# Patient Record
Sex: Female | Born: 1950 | Race: White | Hispanic: No | Marital: Single | Smoking: Never smoker
Health system: Southern US, Community
[De-identification: ages and names within clinical notes are randomized; demographics above are authoritative.]

## PROBLEM LIST (undated history)

## (undated) DIAGNOSIS — I509 Heart failure, unspecified: Secondary | ICD-10-CM

## (undated) DIAGNOSIS — G839 Paralytic syndrome, unspecified: Secondary | ICD-10-CM

## (undated) DIAGNOSIS — K219 Gastro-esophageal reflux disease without esophagitis: Secondary | ICD-10-CM

## (undated) DIAGNOSIS — G709 Myoneural disorder, unspecified: Secondary | ICD-10-CM

## (undated) DIAGNOSIS — G8929 Other chronic pain: Secondary | ICD-10-CM

## (undated) DIAGNOSIS — F419 Anxiety disorder, unspecified: Secondary | ICD-10-CM

## (undated) DIAGNOSIS — E785 Hyperlipidemia, unspecified: Secondary | ICD-10-CM

## (undated) DIAGNOSIS — IMO0001 Reserved for inherently not codable concepts without codable children: Secondary | ICD-10-CM

## (undated) DIAGNOSIS — N390 Urinary tract infection, site not specified: Secondary | ICD-10-CM

## (undated) DIAGNOSIS — G950 Syringomyelia and syringobulbia: Secondary | ICD-10-CM

## (undated) DIAGNOSIS — I10 Essential (primary) hypertension: Secondary | ICD-10-CM

## (undated) DIAGNOSIS — K5909 Other constipation: Secondary | ICD-10-CM

## (undated) DIAGNOSIS — I5181 Takotsubo syndrome: Secondary | ICD-10-CM

## (undated) DIAGNOSIS — I739 Peripheral vascular disease, unspecified: Secondary | ICD-10-CM

## (undated) HISTORY — PX: NECK SURGERY: SHX720

---

## 2014-12-29 ENCOUNTER — Emergency Department (HOSPITAL_COMMUNITY): Payer: Medicare Other

## 2014-12-29 ENCOUNTER — Encounter (HOSPITAL_COMMUNITY): Payer: Self-pay | Admitting: *Deleted

## 2014-12-29 ENCOUNTER — Emergency Department (HOSPITAL_COMMUNITY)
Admission: EM | Admit: 2014-12-29 | Discharge: 2014-12-29 | Disposition: A | Discharge status: Home / Self Care | Payer: Medicare Other | Attending: Emergency Medicine | Admitting: Emergency Medicine

## 2014-12-29 DIAGNOSIS — T80212A Local infection due to central venous catheter, initial encounter: Secondary | ICD-10-CM | POA: Insufficient documentation

## 2014-12-29 DIAGNOSIS — Z792 Long term (current) use of antibiotics: Secondary | ICD-10-CM | POA: Diagnosis not present

## 2014-12-29 DIAGNOSIS — G8929 Other chronic pain: Secondary | ICD-10-CM | POA: Diagnosis not present

## 2014-12-29 DIAGNOSIS — Z79899 Other long term (current) drug therapy: Secondary | ICD-10-CM | POA: Insufficient documentation

## 2014-12-29 DIAGNOSIS — T80219A Unspecified infection due to central venous catheter, initial encounter: Secondary | ICD-10-CM

## 2014-12-29 DIAGNOSIS — K5900 Constipation, unspecified: Secondary | ICD-10-CM | POA: Insufficient documentation

## 2014-12-29 DIAGNOSIS — E785 Hyperlipidemia, unspecified: Secondary | ICD-10-CM | POA: Insufficient documentation

## 2014-12-29 DIAGNOSIS — Z8669 Personal history of other diseases of the nervous system and sense organs: Secondary | ICD-10-CM | POA: Diagnosis not present

## 2014-12-29 DIAGNOSIS — Z7982 Long term (current) use of aspirin: Secondary | ICD-10-CM | POA: Diagnosis not present

## 2014-12-29 DIAGNOSIS — B999 Unspecified infectious disease: Secondary | ICD-10-CM

## 2014-12-29 DIAGNOSIS — Y832 Surgical operation with anastomosis, bypass or graft as the cause of abnormal reaction of the patient, or of later complication, without mention of misadventure at the time of the procedure: Secondary | ICD-10-CM | POA: Diagnosis not present

## 2014-12-29 DIAGNOSIS — Z87440 Personal history of urinary (tract) infections: Secondary | ICD-10-CM | POA: Diagnosis not present

## 2014-12-29 HISTORY — DX: Urinary tract infection, site not specified: N39.0

## 2014-12-29 HISTORY — DX: Other chronic pain: G89.29

## 2014-12-29 HISTORY — DX: Other constipation: K59.09

## 2014-12-29 HISTORY — DX: Syringomyelia and syringobulbia: G95.0

## 2014-12-29 HISTORY — DX: Hyperlipidemia, unspecified: E78.5

## 2014-12-29 HISTORY — DX: Paralytic syndrome, unspecified: G83.9

## 2014-12-29 LAB — CBC WITH DIFFERENTIAL/PLATELET
Basophils Absolute: 0 10*3/uL (ref 0.0–0.1)
Basophils Relative: 0 % (ref 0–1)
EOS ABS: 0.1 10*3/uL (ref 0.0–0.7)
EOS PCT: 2 % (ref 0–5)
HEMATOCRIT: 35.9 % — AB (ref 36.0–46.0)
Hemoglobin: 11.4 g/dL — ABNORMAL LOW (ref 12.0–15.0)
LYMPHS ABS: 0.9 10*3/uL (ref 0.7–4.0)
LYMPHS PCT: 15 % (ref 12–46)
MCH: 27.7 pg (ref 26.0–34.0)
MCHC: 31.8 g/dL (ref 30.0–36.0)
MCV: 87.1 fL (ref 78.0–100.0)
MONOS PCT: 8 % (ref 3–12)
Monocytes Absolute: 0.5 10*3/uL (ref 0.1–1.0)
Neutro Abs: 4.7 10*3/uL (ref 1.7–7.7)
Neutrophils Relative %: 75 % (ref 43–77)
Platelets: 216 10*3/uL (ref 150–400)
RBC: 4.12 MIL/uL (ref 3.87–5.11)
RDW: 13.6 % (ref 11.5–15.5)
WBC: 6.2 10*3/uL (ref 4.0–10.5)

## 2014-12-29 LAB — COMPREHENSIVE METABOLIC PANEL
ALBUMIN: 3.9 g/dL (ref 3.5–5.0)
ALK PHOS: 71 U/L (ref 38–126)
ALT: 13 U/L — ABNORMAL LOW (ref 14–54)
AST: 29 U/L (ref 15–41)
Anion gap: 9 (ref 5–15)
BILIRUBIN TOTAL: 1.1 mg/dL (ref 0.3–1.2)
BUN: 8 mg/dL (ref 6–20)
CHLORIDE: 102 mmol/L (ref 101–111)
CO2: 27 mmol/L (ref 22–32)
Calcium: 9 mg/dL (ref 8.9–10.3)
Creatinine, Ser: 0.59 mg/dL (ref 0.44–1.00)
GFR calc Af Amer: >60 mL/min (ref >60)
GFR calc non Af Amer: >60 mL/min (ref >60)
Glucose, Bld: 93 mg/dL (ref 65–99)
POTASSIUM: 4.9 mmol/L (ref 3.5–5.1)
Sodium: 138 mmol/L (ref 135–145)
Total Protein: 7.6 g/dL (ref 6.5–8.1)

## 2014-12-29 LAB — PROTIME-INR
INR: 1.02 (ref 0.00–1.49)
Prothrombin Time: 13.6 seconds (ref 11.6–15.2)

## 2014-12-29 LAB — APTT: aPTT: 30 seconds (ref 24–37)

## 2014-12-29 MED ORDER — CEFAZOLIN SODIUM-DEXTROSE 2-3 GM-% IV SOLR
INTRAVENOUS | Status: AC
  Filled 2014-12-29: qty 50

## 2014-12-29 MED ORDER — LIDOCAINE-EPINEPHRINE 2 %-1:100000 IJ SOLN
INTRAMUSCULAR | Status: AC
  Filled 2014-12-29: qty 1

## 2014-12-29 MED ORDER — MIDAZOLAM HCL 2 MG/2ML IJ SOLN
INTRAMUSCULAR | Status: AC
  Filled 2014-12-29: qty 4

## 2014-12-29 MED ORDER — CEFAZOLIN SODIUM-DEXTROSE 2-3 GM-% IV SOLR
2.0000 g | INTRAVENOUS | Status: AC
  Administered 2014-12-29: 2 g via INTRAVENOUS

## 2014-12-29 MED ORDER — FENTANYL CITRATE (PF) 100 MCG/2ML IJ SOLN
INTRAMUSCULAR | Status: AC
  Filled 2014-12-29: qty 2

## 2014-12-29 NOTE — ED Notes (Signed)
[facility] called for transport.  
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
## 2014-12-29 NOTE — Procedures (Signed)
Successful removal of right anterior chest wall port-a-cath. No immediate post procedural complications.  

## 2014-12-29 NOTE — ED Notes (Signed)
Patient transported to X-ray 

## 2014-12-29 NOTE — Progress Notes (Signed)
pcp is [facility] on site md

## 2014-12-29 NOTE — ED Notes (Signed)
Patient transported to MRI 

## 2014-12-29 NOTE — ED Notes (Signed)
Reviewed discharge information with pt, explaining signs and symptoms of infection.  She verbalized understanding and knows that is s/s return to request that her PCP evaluate the former port site.

## 2014-12-29 NOTE — ED Notes (Addendum)
Per ems pt from home, recently moved from [place], pt is at [facility] [place]. Home health came to check on pt this morning and saw port in right chest was oozing. Pt reports port has been progressively getting redder x2 weeks. Last time port was accessed was 5 weeks ago.   Pt reports she has port due to bad IV access.

## 2014-12-29 NOTE — ED Notes (Signed)
Patient transported to IR 

## 2014-12-29 NOTE — ED Notes (Signed)
In interventional radiology

## 2014-12-29 NOTE — ED Notes (Signed)
Provided pt with drink and snack per her request; emptied catheter bag. No acute distress noted.

## 2014-12-29 NOTE — ED Notes (Signed)
Bed: IN86 Expected date:  Expected time:  Means of arrival:  Comments: Ems- wheelchair bound, port issue

## 2014-12-29 NOTE — H&P (Signed)
Chief Complaint: Chief Complaint  Patient presents with  . infected port     Referring Physician(s): Pickering,N  History of Present Illness: Deanna Morgan is a 64 y.o. female who presents today from the emergency room secondary to tenderness and erythema at right upper chest Port-A-Cath site. Patient had right Port-A-Cath placed in [place] for poor venous access. Port was last accessed approximately 5 weeks ago. Additional past medical history as listed below. Today she also noted small amount of bleeding around the port hub. She presents today for Port-A-Cath removal.  Past Medical History  Diagnosis Date  . Chronic pain   . Chronic constipation   . Chronic UTI   . Syringomyelia   . Paralysis   . Hyperlipidemia     No past surgical history on file.  Allergies: Review of patient's allergies indicates no known allergies.  Medications: Prior to Admission medications   Medication Sig Start Date End Date Taking? Authorizing Provider  acidophilus (RISAQUAD) CAPS capsule Take 1 capsule by mouth 2 (two) times daily.   Yes Historical Provider, MD  ALPRAZolam Prudy Feeler) 0.5 MG tablet Take 0.5 mg by mouth every 8 (eight) hours as needed for anxiety.   Yes Historical Provider, MD  aspirin 81 MG tablet Take 81 mg by mouth daily.   Yes Historical Provider, MD  baclofen (LIORESAL) 20 MG tablet Take 20 mg by mouth 2 (two) times daily.   Yes Historical Provider, MD  bisacodyl (LAXATIVE) 10 MG suppository Place 10 mg rectally as needed for mild constipation or moderate constipation.   Yes Historical Provider, MD  Chloroxylenol-Zinc Oxide (BAZA EX) Apply 1 application topically 2 (two) times daily. Apply to buttocks until healed   Yes Historical Provider, MD  clindamycin (CLEOCIN) 300 MG capsule Take 300 mg by mouth 3 (three) times daily.   Yes Historical Provider, MD  Cranberry 425 MG CAPS Take 425 mg by mouth 2 (two) times daily.   Yes Historical Provider, MD  docusate sodium  (COLACE) 100 MG capsule Take 100 mg by mouth 2 (two) times daily.   Yes Historical Provider, MD  fluocinonide cream (LIDEX) 0.05 % Apply 1 application topically daily. Apply to affected areas on scalp   Yes Historical Provider, MD  ibuprofen (ADVIL,MOTRIN) 200 MG tablet Take 400 mg by mouth every 8 (eight) hours as needed for mild pain or moderate pain.   Yes Historical Provider, MD  ketoconazole (NIZORAL) 2 % shampoo Apply 1 application topically 2 (two) times a week. Provide and apply on skin as directed. Lather and let sit for a few minutes prior to rinsing when showering.   Yes Historical Provider, MD  Mineral Oil Heavy OIL Place 2 drops into both ears once a week.   Yes Historical Provider, MD  morphine (MS CONTIN) 60 MG 12 hr tablet Take 60 mg by mouth 2 (two) times daily.   Yes Historical Provider, MD  Multiple Vitamin (DAILY VITE PO) Take 1 tablet by mouth daily.   Yes Historical Provider, MD  Omega 3 1000 MG CAPS Take 1,000 mg by mouth daily.   Yes Historical Provider, MD  oxybutynin (DITROPAN XL) 15 MG 24 hr tablet Take 15 mg by mouth daily.   Yes Historical Provider, MD  polyethylene glycol (MIRALAX / GLYCOLAX) packet Take 17 g by mouth daily as needed for mild constipation or moderate constipation. Mix 1 capful (17g) with 8 oz of fluid   Yes Historical Provider, MD  saccharomyces boulardii (FLORASTOR) 250 MG capsule Take  250 mg by mouth 2 (two) times daily.   Yes Historical Provider, MD  sertraline (ZOLOFT) 50 MG tablet Take 50 mg by mouth daily.   Yes Historical Provider, MD  simvastatin (ZOCOR) 40 MG tablet Take 40 mg by mouth at bedtime.   Yes Historical Provider, MD  sodium phosphate (FLEET) enema Place 1 enema rectally daily as needed (constipation). follow package directions   Yes Historical Provider, MD  vitamin C (ASCORBIC ACID) 500 MG tablet Take 500 mg by mouth 2 (two) times daily.   Yes Historical Provider, MD     No family history on file.  History   Social History  .  Marital Status: Single    Spouse Name: N/A  . Number of Children: N/A  . Years of Education: N/A   Social History Main Topics  . Smoking status: Not on file  . Smokeless tobacco: Not on file  . Alcohol Use: Not on file  . Drug Use: Not on file  . Sexual Activity: Not on file   Other Topics Concern  . Not on file   Social History Narrative  . No narrative on file      Review of Systems  Constitutional: Negative for fever and chills.  Respiratory: Negative for cough and shortness of breath.   Cardiovascular:       Denies substernal chest pain. Mild pain noted at right chest wall port site  Gastrointestinal: Negative for nausea, vomiting and abdominal pain.  Genitourinary: Negative for dysuria and hematuria.  Musculoskeletal: Negative for back pain.  Neurological: Negative for headaches.    Vital Signs: BP 122/70 mmHg  Pulse 76  Temp(Src) 98.5 F (36.9 C) (Oral)  Resp 16  SpO2 96%  Physical Exam  Constitutional: She is oriented to person, place, and time.  Cardiovascular: Normal rate and regular rhythm.   Pulmonary/Chest: Effort normal.  Minutes breath sounds right base, left clear. Right upper chest wall Port-A-Cath site with erythema, small amount of skin breakdown over the port hub and mild to moderate tenderness to palpation  Abdominal: Soft. Bowel sounds are normal. There is no tenderness.  Mild distention  Musculoskeletal:  Patient is wheelchair bound; history of syringomyelia and paralysis; arthritic changes both hands  Neurological: She is alert and oriented to person, place, and time.    Mallampati Score:     Imaging: Dg Chest 2 View  12/29/2014   CLINICAL DATA:  Bruising from right chest port.  Weakness  EXAM: CHEST  2 VIEW  COMPARISON:  None.  FINDINGS: Ill-defined opacity is noted in the periphery of the right lower lobe. There is some ill-defined opacity in the right mid lung, inferior to the Port-A-Cath hub. Lungs elsewhere are clear. Heart size  and pulmonary vascularity are normal. No adenopathy. Port-A-Cath tip is in the superior vena cava. No pneumothorax. There is a total shoulder replacement on the right with dislocation of the humerus medial to the glenoid. There is dislocation of the left shoulder with the left humeral head just inferior to the coracoid process.  IMPRESSION: Infiltrate periphery of right lower lobe. Suspect pneumonia. Ill-defined opacity right mid lung could represent pneumonia but also could represent leakage from the hub of the Port-A-Cath. Lungs elsewhere clear. Bilateral shoulder dislocations. No total shoulder replacement on the right. Followup PA and lateral chest radiographs recommended in 3-4 weeks following trial of antibiotic therapy to ensure resolution and exclude underlying malignancy.   Electronically Signed   By: Bretta Bang III M.D.   On: 12/29/2014  13:29    Labs:  CBC:  Recent Labs  12/29/14 1210  WBC 6.2  HGB 11.4*  HCT 35.9*  PLT 216    COAGS:  Recent Labs  12/29/14 1210  INR 1.02  APTT 30    BMP:  Recent Labs  12/29/14 1210  NA 138  K 4.9  CL 102  CO2 27  GLUCOSE 93  BUN 8  CALCIUM 9.0  CREATININE 0.59  GFRNONAA >60  GFRAA >60    LIVER FUNCTION TESTS:  Recent Labs  12/29/14 1210  BILITOT 1.1  AST 29  ALT 13*  ALKPHOS 71  PROT 7.6  ALBUMIN 3.9    TUMOR MARKERS: No results for input(s): AFPTM, CEA, CA199, CHROMGRNA in the last 8760 hours.  Assessment and Plan: Patient with erythematous, tender right chest wall Port-A-Cath with associated superficial skin breakdown at port hub. Plan today is for Port-A-Cath removal. Details/risks of procedure, including but not limited to, bleeding, infection discussed with patient with her understanding and consent.     Signed: D. Jeananne Rama 12/29/2014, 2:29 PM   I spent a total of 20 minutes in face to face in clinical consultation, greater than 50% of which was counseling/coordinating care for Port-A-Cath  removal

## 2014-12-30 ENCOUNTER — Other Ambulatory Visit (HOSPITAL_COMMUNITY): Payer: Self-pay | Admitting: Interventional Radiology

## 2014-12-30 DIAGNOSIS — B999 Unspecified infectious disease: Secondary | ICD-10-CM

## 2014-12-31 ENCOUNTER — Ambulatory Visit (HOSPITAL_COMMUNITY)
Admission: RE | Admit: 2014-12-31 | Discharge: 2014-12-31 | Disposition: A | Discharge status: Home / Self Care | Payer: Medicare Other | Source: Ambulatory Visit | Attending: Interventional Radiology | Admitting: Interventional Radiology

## 2014-12-31 DIAGNOSIS — Z4800 Encounter for change or removal of nonsurgical wound dressing: Secondary | ICD-10-CM | POA: Insufficient documentation

## 2014-12-31 DIAGNOSIS — B999 Unspecified infectious disease: Secondary | ICD-10-CM

## 2014-12-31 NOTE — Progress Notes (Signed)
Patient ID: Deanna Morgan, female   DOB: [birth year], 64 y.o.   MRN: [id number]   Pt returns for outpt followup of the port site  Rt chest site iodoform gauze removed.  Port site with mild erythema, and induration.  Feels less painful.  No drainage  Wet to dry dressing performed by IR nursing staff.  Continue Wet to dry dressing changes BID for 7 days at the SNF.  Orders written for this to return with the patient.

## 2015-03-10 DIAGNOSIS — I5181 Takotsubo syndrome: Secondary | ICD-10-CM

## 2015-03-10 HISTORY — DX: Takotsubo syndrome: I51.81

## 2015-03-14 ENCOUNTER — Other Ambulatory Visit (HOSPITAL_COMMUNITY): Payer: Medicare Other

## 2015-03-14 ENCOUNTER — Emergency Department (HOSPITAL_COMMUNITY): Payer: Medicare Other

## 2015-03-14 ENCOUNTER — Inpatient Hospital Stay (HOSPITAL_COMMUNITY): Payer: Medicare Other

## 2015-03-14 ENCOUNTER — Encounter (HOSPITAL_COMMUNITY): Payer: Self-pay | Admitting: *Deleted

## 2015-03-14 ENCOUNTER — Inpatient Hospital Stay (HOSPITAL_COMMUNITY)
Admission: EM | Admit: 2015-03-14 | Discharge: 2015-03-25 | DRG: 871 | Disposition: A | Discharge status: Nursing Home (Includes ICF) | Payer: Medicare Other | Attending: Internal Medicine | Admitting: Internal Medicine

## 2015-03-14 DIAGNOSIS — N3289 Other specified disorders of bladder: Secondary | ICD-10-CM | POA: Diagnosis not present

## 2015-03-14 DIAGNOSIS — I2510 Atherosclerotic heart disease of native coronary artery without angina pectoris: Secondary | ICD-10-CM | POA: Diagnosis present

## 2015-03-14 DIAGNOSIS — G8929 Other chronic pain: Secondary | ICD-10-CM | POA: Diagnosis present

## 2015-03-14 DIAGNOSIS — I214 Non-ST elevation (NSTEMI) myocardial infarction: Secondary | ICD-10-CM | POA: Insufficient documentation

## 2015-03-14 DIAGNOSIS — I5181 Takotsubo syndrome: Secondary | ICD-10-CM | POA: Diagnosis present

## 2015-03-14 DIAGNOSIS — R34 Anuria and oliguria: Secondary | ICD-10-CM | POA: Diagnosis present

## 2015-03-14 DIAGNOSIS — E876 Hypokalemia: Secondary | ICD-10-CM | POA: Diagnosis present

## 2015-03-14 DIAGNOSIS — T8351XA Infection and inflammatory reaction due to indwelling urinary catheter, initial encounter: Secondary | ICD-10-CM | POA: Diagnosis present

## 2015-03-14 DIAGNOSIS — R7989 Other specified abnormal findings of blood chemistry: Secondary | ICD-10-CM

## 2015-03-14 DIAGNOSIS — K5909 Other constipation: Secondary | ICD-10-CM | POA: Diagnosis present

## 2015-03-14 DIAGNOSIS — G9340 Encephalopathy, unspecified: Secondary | ICD-10-CM | POA: Diagnosis present

## 2015-03-14 DIAGNOSIS — K5641 Fecal impaction: Secondary | ICD-10-CM

## 2015-03-14 DIAGNOSIS — R748 Abnormal levels of other serum enzymes: Secondary | ICD-10-CM | POA: Diagnosis not present

## 2015-03-14 DIAGNOSIS — G950 Syringomyelia and syringobulbia: Secondary | ICD-10-CM | POA: Diagnosis present

## 2015-03-14 DIAGNOSIS — J690 Pneumonitis due to inhalation of food and vomit: Secondary | ICD-10-CM | POA: Diagnosis present

## 2015-03-14 DIAGNOSIS — Q211 Atrial septal defect: Secondary | ICD-10-CM | POA: Diagnosis not present

## 2015-03-14 DIAGNOSIS — R0902 Hypoxemia: Secondary | ICD-10-CM | POA: Diagnosis not present

## 2015-03-14 DIAGNOSIS — R1310 Dysphagia, unspecified: Secondary | ICD-10-CM | POA: Diagnosis present

## 2015-03-14 DIAGNOSIS — E785 Hyperlipidemia, unspecified: Secondary | ICD-10-CM | POA: Diagnosis present

## 2015-03-14 DIAGNOSIS — Y846 Urinary catheterization as the cause of abnormal reaction of the patient, or of later complication, without mention of misadventure at the time of the procedure: Secondary | ICD-10-CM | POA: Diagnosis present

## 2015-03-14 DIAGNOSIS — K567 Ileus, unspecified: Secondary | ICD-10-CM | POA: Insufficient documentation

## 2015-03-14 DIAGNOSIS — J189 Pneumonia, unspecified organism: Secondary | ICD-10-CM | POA: Diagnosis not present

## 2015-03-14 DIAGNOSIS — K5901 Slow transit constipation: Secondary | ICD-10-CM | POA: Diagnosis not present

## 2015-03-14 DIAGNOSIS — E780 Pure hypercholesterolemia: Secondary | ICD-10-CM | POA: Diagnosis present

## 2015-03-14 DIAGNOSIS — K562 Volvulus: Secondary | ICD-10-CM | POA: Diagnosis present

## 2015-03-14 DIAGNOSIS — R109 Unspecified abdominal pain: Secondary | ICD-10-CM | POA: Insufficient documentation

## 2015-03-14 DIAGNOSIS — Y95 Nosocomial condition: Secondary | ICD-10-CM | POA: Diagnosis present

## 2015-03-14 DIAGNOSIS — E872 Acidosis: Secondary | ICD-10-CM | POA: Diagnosis present

## 2015-03-14 DIAGNOSIS — J9601 Acute respiratory failure with hypoxia: Secondary | ICD-10-CM | POA: Insufficient documentation

## 2015-03-14 DIAGNOSIS — I4510 Unspecified right bundle-branch block: Secondary | ICD-10-CM | POA: Diagnosis present

## 2015-03-14 DIAGNOSIS — I10 Essential (primary) hypertension: Secondary | ICD-10-CM | POA: Diagnosis present

## 2015-03-14 DIAGNOSIS — Z7982 Long term (current) use of aspirin: Secondary | ICD-10-CM

## 2015-03-14 DIAGNOSIS — I739 Peripheral vascular disease, unspecified: Secondary | ICD-10-CM | POA: Diagnosis present

## 2015-03-14 DIAGNOSIS — F419 Anxiety disorder, unspecified: Secondary | ICD-10-CM | POA: Diagnosis present

## 2015-03-14 DIAGNOSIS — T40605A Adverse effect of unspecified narcotics, initial encounter: Secondary | ICD-10-CM | POA: Diagnosis present

## 2015-03-14 DIAGNOSIS — N390 Urinary tract infection, site not specified: Secondary | ICD-10-CM | POA: Diagnosis present

## 2015-03-14 DIAGNOSIS — Z79899 Other long term (current) drug therapy: Secondary | ICD-10-CM

## 2015-03-14 DIAGNOSIS — G8250 Quadriplegia, unspecified: Secondary | ICD-10-CM | POA: Diagnosis present

## 2015-03-14 DIAGNOSIS — E871 Hypo-osmolality and hyponatremia: Secondary | ICD-10-CM | POA: Diagnosis present

## 2015-03-14 DIAGNOSIS — Z993 Dependence on wheelchair: Secondary | ICD-10-CM | POA: Diagnosis not present

## 2015-03-14 DIAGNOSIS — I959 Hypotension, unspecified: Secondary | ICD-10-CM

## 2015-03-14 DIAGNOSIS — I5032 Chronic diastolic (congestive) heart failure: Secondary | ICD-10-CM | POA: Insufficient documentation

## 2015-03-14 DIAGNOSIS — I5021 Acute systolic (congestive) heart failure: Secondary | ICD-10-CM | POA: Diagnosis not present

## 2015-03-14 DIAGNOSIS — K560 Paralytic ileus: Secondary | ICD-10-CM | POA: Insufficient documentation

## 2015-03-14 DIAGNOSIS — K219 Gastro-esophageal reflux disease without esophagitis: Secondary | ICD-10-CM | POA: Diagnosis present

## 2015-03-14 DIAGNOSIS — R4182 Altered mental status, unspecified: Secondary | ICD-10-CM | POA: Diagnosis not present

## 2015-03-14 DIAGNOSIS — K5900 Constipation, unspecified: Secondary | ICD-10-CM | POA: Insufficient documentation

## 2015-03-14 DIAGNOSIS — A419 Sepsis, unspecified organism: Secondary | ICD-10-CM | POA: Diagnosis present

## 2015-03-14 DIAGNOSIS — I5041 Acute combined systolic (congestive) and diastolic (congestive) heart failure: Secondary | ICD-10-CM | POA: Diagnosis present

## 2015-03-14 DIAGNOSIS — L8990 Pressure ulcer of unspecified site, unspecified stage: Secondary | ICD-10-CM | POA: Insufficient documentation

## 2015-03-14 DIAGNOSIS — R0600 Dyspnea, unspecified: Secondary | ICD-10-CM | POA: Diagnosis not present

## 2015-03-14 DIAGNOSIS — I428 Other cardiomyopathies: Secondary | ICD-10-CM | POA: Diagnosis not present

## 2015-03-14 DIAGNOSIS — R100 Acute abdomen: Secondary | ICD-10-CM | POA: Diagnosis not present

## 2015-03-14 HISTORY — DX: Gastro-esophageal reflux disease without esophagitis: K21.9

## 2015-03-14 HISTORY — DX: Essential (primary) hypertension: I10

## 2015-03-14 HISTORY — DX: Anxiety disorder, unspecified: F41.9

## 2015-03-14 HISTORY — DX: Peripheral vascular disease, unspecified: I73.9

## 2015-03-14 HISTORY — DX: Reserved for inherently not codable concepts without codable children: IMO0001

## 2015-03-14 HISTORY — DX: Myoneural disorder, unspecified: G70.9

## 2015-03-14 LAB — URINALYSIS, ROUTINE W REFLEX MICROSCOPIC
BILIRUBIN URINE: NEGATIVE
Glucose, UA: NEGATIVE mg/dL
Ketones, ur: NEGATIVE mg/dL
NITRITE: POSITIVE — AB
PH: 6.5 (ref 5.0–8.0)
Protein, ur: NEGATIVE mg/dL
SPECIFIC GRAVITY, URINE: 1.009 (ref 1.005–1.030)
UROBILINOGEN UA: 0.2 mg/dL (ref 0.0–1.0)

## 2015-03-14 LAB — I-STAT TROPONIN, ED: Troponin i, poc: 9.96 ng/mL (ref 0.00–0.08)

## 2015-03-14 LAB — CBC WITH DIFFERENTIAL/PLATELET
Basophils Absolute: 0 10*3/uL (ref 0.0–0.1)
Basophils Relative: 0 % (ref 0–1)
Eosinophils Absolute: 0 10*3/uL (ref 0.0–0.7)
Eosinophils Relative: 0 % (ref 0–5)
HEMATOCRIT: 39.4 % (ref 36.0–46.0)
HEMOGLOBIN: 12.8 g/dL (ref 12.0–15.0)
LYMPHS ABS: 1.3 10*3/uL (ref 0.7–4.0)
LYMPHS PCT: 12 % (ref 12–46)
MCH: 28.3 pg (ref 26.0–34.0)
MCHC: 32.5 g/dL (ref 30.0–36.0)
MCV: 87.2 fL (ref 78.0–100.0)
MONO ABS: 1.2 10*3/uL — AB (ref 0.1–1.0)
MONOS PCT: 11 % (ref 3–12)
NEUTROS ABS: 9 10*3/uL — AB (ref 1.7–7.7)
Neutrophils Relative %: 77 % (ref 43–77)
Platelets: 225 10*3/uL (ref 150–400)
RBC: 4.52 MIL/uL (ref 3.87–5.11)
RDW: 13.5 % (ref 11.5–15.5)
WBC: 11.6 10*3/uL — ABNORMAL HIGH (ref 4.0–10.5)

## 2015-03-14 LAB — I-STAT CHEM 8, ED
BUN: 19 mg/dL (ref 6–20)
CREATININE: 0.7 mg/dL (ref 0.44–1.00)
Calcium, Ion: 1.07 mmol/L — ABNORMAL LOW (ref 1.13–1.30)
Chloride: 98 mmol/L — ABNORMAL LOW (ref 101–111)
Glucose, Bld: 124 mg/dL — ABNORMAL HIGH (ref 65–99)
HCT: 41 % (ref 36.0–46.0)
HEMOGLOBIN: 13.9 g/dL (ref 12.0–15.0)
Potassium: 3.5 mmol/L (ref 3.5–5.1)
Sodium: 133 mmol/L — ABNORMAL LOW (ref 135–145)
TCO2: 23 mmol/L (ref 0–100)

## 2015-03-14 LAB — HEPATIC FUNCTION PANEL
ALT: 21 U/L (ref 14–54)
AST: 71 U/L — AB (ref 15–41)
Albumin: 3.8 g/dL (ref 3.5–5.0)
Alkaline Phosphatase: 55 U/L (ref 38–126)
BILIRUBIN DIRECT: 0.1 mg/dL (ref 0.1–0.5)
BILIRUBIN INDIRECT: 0.6 mg/dL (ref 0.3–0.9)
Total Bilirubin: 0.7 mg/dL (ref 0.3–1.2)
Total Protein: 7 g/dL (ref 6.5–8.1)

## 2015-03-14 LAB — BLOOD GAS, ARTERIAL
Acid-base deficit: 5.9 mmol/L — ABNORMAL HIGH (ref 0.0–2.0)
BICARBONATE: 19.4 meq/L — AB (ref 20.0–24.0)
DRAWN BY: 441261
FIO2: 1
O2 Saturation: 99.4 %
PCO2 ART: 39.6 mmHg (ref 35.0–45.0)
PH ART: 7.311 — AB (ref 7.350–7.450)
PO2 ART: 264 mmHg — AB (ref 80.0–100.0)
Patient temperature: 98.6
TCO2: 18 mmol/L (ref 0–100)

## 2015-03-14 LAB — I-STAT CG4 LACTIC ACID, ED
LACTIC ACID, VENOUS: 1.25 mmol/L (ref 0.5–2.0)
Lactic Acid, Venous: 1.56 mmol/L (ref 0.5–2.0)

## 2015-03-14 LAB — GLUCOSE, CAPILLARY
GLUCOSE-CAPILLARY: 113 mg/dL — AB (ref 65–99)
Glucose-Capillary: 137 mg/dL — ABNORMAL HIGH (ref 65–99)
Glucose-Capillary: 101 mg/dL — ABNORMAL HIGH (ref 65–99)

## 2015-03-14 LAB — CK: Total CK: 532 U/L — ABNORMAL HIGH (ref 38–234)

## 2015-03-14 LAB — TROPONIN I
TROPONIN I: 5.52 ng/mL — AB (ref <0.031)
TROPONIN I: 5.24 ng/mL — AB (ref <0.031)
Troponin I: 8.04 ng/mL (ref <0.031)

## 2015-03-14 LAB — CREATININE, SERUM
Creatinine, Ser: 0.76 mg/dL (ref 0.44–1.00)
GFR calc non Af Amer: >60 mL/min (ref >60)

## 2015-03-14 LAB — AMYLASE: AMYLASE: 35 U/L (ref 28–100)

## 2015-03-14 LAB — MRSA PCR SCREENING: MRSA by PCR: NEGATIVE

## 2015-03-14 LAB — PROCALCITONIN

## 2015-03-14 LAB — LACTATE DEHYDROGENASE: LDH: 277 U/L — ABNORMAL HIGH (ref 98–192)

## 2015-03-14 LAB — CORTISOL: Cortisol, Plasma: 16.1 ug/dL

## 2015-03-14 LAB — URINE MICROSCOPIC-ADD ON

## 2015-03-14 LAB — TSH: TSH: 3.056 u[IU]/mL (ref 0.350–4.500)

## 2015-03-14 LAB — LIPASE, BLOOD: LIPASE: 12 U/L — AB (ref 22–51)

## 2015-03-14 MED ORDER — INFLUENZA VAC SPLIT QUAD 0.5 ML IM SUSY
0.5000 mL | PREFILLED_SYRINGE | INTRAMUSCULAR | Status: DC | PRN
  Filled 2015-03-14: qty 0.5

## 2015-03-14 MED ORDER — CETYLPYRIDINIUM CHLORIDE 0.05 % MT LIQD
7.0000 mL | Freq: Two times a day (BID) | OROMUCOSAL | Status: DC
  Administered 2015-03-14 – 2015-03-24 (×18): 7 mL via OROMUCOSAL

## 2015-03-14 MED ORDER — SODIUM CHLORIDE 0.9 % IV SOLN
INTRAVENOUS | Status: DC
  Administered 2015-03-14 – 2015-03-15 (×2): via INTRAVENOUS

## 2015-03-14 MED ORDER — INSULIN ASPART 100 UNIT/ML ~~LOC~~ SOLN
0.0000 [IU] | SUBCUTANEOUS | Status: DC
  Administered 2015-03-14 – 2015-03-15 (×3): 1 [IU] via SUBCUTANEOUS
  Administered 2015-03-15 (×2): 2 [IU] via SUBCUTANEOUS
  Administered 2015-03-15 – 2015-03-18 (×5): 1 [IU] via SUBCUTANEOUS

## 2015-03-14 MED ORDER — VANCOMYCIN HCL IN DEXTROSE 1-5 GM/200ML-% IV SOLN
1000.0000 mg | INTRAVENOUS | Status: AC
  Administered 2015-03-14: 1000 mg via INTRAVENOUS
  Filled 2015-03-14: qty 200

## 2015-03-14 MED ORDER — ASPIRIN 81 MG PO CHEW: 324.0000 mg | CHEWABLE_TABLET | ORAL | Status: AC

## 2015-03-14 MED ORDER — SODIUM CHLORIDE 0.9 % IV BOLUS (SEPSIS)
250.0000 mL | Freq: Once | INTRAVENOUS | Status: AC
  Administered 2015-03-14: 250 mL via INTRAVENOUS

## 2015-03-14 MED ORDER — PIPERACILLIN-TAZOBACTAM 3.375 G IVPB 30 MIN
3.3750 g | INTRAVENOUS | Status: AC
  Administered 2015-03-14: 3.375 g via INTRAVENOUS
  Filled 2015-03-14: qty 50

## 2015-03-14 MED ORDER — FENTANYL CITRATE (PF) 100 MCG/2ML IJ SOLN
12.5000 ug | INTRAMUSCULAR | Status: DC | PRN
  Administered 2015-03-14 (×3): 12.5 ug via INTRAVENOUS
  Filled 2015-03-14 (×3): qty 2

## 2015-03-14 MED ORDER — IPRATROPIUM-ALBUTEROL 0.5-2.5 (3) MG/3ML IN SOLN
3.0000 mL | RESPIRATORY_TRACT | Status: DC | PRN
  Administered 2015-03-14 – 2015-03-16 (×9): 3 mL via RESPIRATORY_TRACT
  Filled 2015-03-14 (×10): qty 3

## 2015-03-14 MED ORDER — ALPRAZOLAM 0.5 MG PO TABS
0.5000 mg | ORAL_TABLET | Freq: Three times a day (TID) | ORAL | Status: DC | PRN
  Administered 2015-03-14: 0.5 mg via ORAL
  Filled 2015-03-14: qty 1

## 2015-03-14 MED ORDER — ONDANSETRON HCL 4 MG/2ML IJ SOLN
4.0000 mg | Freq: Four times a day (QID) | INTRAMUSCULAR | Status: DC | PRN
  Administered 2015-03-14: 4 mg via INTRAVENOUS
  Filled 2015-03-14: qty 2

## 2015-03-14 MED ORDER — ALPRAZOLAM 0.5 MG PO TABS
0.5000 mg | ORAL_TABLET | Freq: Four times a day (QID) | ORAL | Status: DC | PRN
  Administered 2015-03-14 – 2015-03-25 (×15): 0.5 mg via ORAL
  Filled 2015-03-14 (×15): qty 1

## 2015-03-14 MED ORDER — VANCOMYCIN HCL 500 MG IV SOLR
500.0000 mg | Freq: Two times a day (BID) | INTRAVENOUS | Status: DC
  Administered 2015-03-14 – 2015-03-17 (×6): 500 mg via INTRAVENOUS
  Filled 2015-03-14 (×8): qty 500

## 2015-03-14 MED ORDER — LACTULOSE 10 GM/15ML PO SOLN
30.0000 g | Freq: Once | ORAL | Status: AC
  Administered 2015-03-14: 30 g via ORAL
  Filled 2015-03-14: qty 45

## 2015-03-14 MED ORDER — ASPIRIN 300 MG RE SUPP
300.0000 mg | RECTAL | Status: AC
  Administered 2015-03-14: 300 mg via RECTAL
  Filled 2015-03-14: qty 1

## 2015-03-14 MED ORDER — CHLORHEXIDINE GLUCONATE 0.12 % MT SOLN
15.0000 mL | Freq: Two times a day (BID) | OROMUCOSAL | Status: DC
  Administered 2015-03-14 – 2015-03-25 (×23): 15 mL via OROMUCOSAL
  Filled 2015-03-14 (×21): qty 15

## 2015-03-14 MED ORDER — MORPHINE SULFATE (PF) 4 MG/ML IV SOLN
4.0000 mg | Freq: Once | INTRAVENOUS | Status: DC
  Filled 2015-03-14: qty 1

## 2015-03-14 MED ORDER — WHITE PETROLATUM GEL
Freq: Once | Status: AC
  Administered 2015-03-14: 1 via TOPICAL
  Filled 2015-03-14: qty 5

## 2015-03-14 MED ORDER — FENTANYL CITRATE (PF) 100 MCG/2ML IJ SOLN
12.5000 ug | INTRAMUSCULAR | Status: DC | PRN
  Administered 2015-03-14: 12.5 ug via INTRAVENOUS
  Administered 2015-03-15 (×5): 25 ug via INTRAVENOUS
  Filled 2015-03-14 (×6): qty 2

## 2015-03-14 MED ORDER — PANTOPRAZOLE SODIUM 40 MG IV SOLR
40.0000 mg | Freq: Every day | INTRAVENOUS | Status: DC
  Administered 2015-03-14 – 2015-03-18 (×5): 40 mg via INTRAVENOUS
  Filled 2015-03-14 (×5): qty 40

## 2015-03-14 MED ORDER — MORPHINE SULFATE (PF) 2 MG/ML IV SOLN
2.0000 mg | Freq: Once | INTRAVENOUS | Status: AC
  Administered 2015-03-14: 2 mg via INTRAVENOUS
  Filled 2015-03-14: qty 1

## 2015-03-14 MED ORDER — SODIUM CHLORIDE 0.9 % IV SOLN
250.0000 mL | INTRAVENOUS | Status: DC | PRN
  Administered 2015-03-16: 20 mL/h via INTRAVENOUS
  Administered 2015-03-23: 250 mL via INTRAVENOUS

## 2015-03-14 MED ORDER — SODIUM CHLORIDE 0.9 % IV BOLUS (SEPSIS)
500.0000 mL | Freq: Once | INTRAVENOUS | Status: AC
  Administered 2015-03-14: 500 mL via INTRAVENOUS

## 2015-03-14 MED ORDER — SODIUM CHLORIDE 0.9 % IV BOLUS (SEPSIS)
1000.0000 mL | INTRAVENOUS | Status: AC
  Administered 2015-03-14 (×2): 1000 mL via INTRAVENOUS

## 2015-03-14 MED ORDER — PIPERACILLIN-TAZOBACTAM 3.375 G IVPB
3.3750 g | Freq: Three times a day (TID) | INTRAVENOUS | Status: DC
  Administered 2015-03-14 – 2015-03-18 (×12): 3.375 g via INTRAVENOUS
  Filled 2015-03-14 (×13): qty 50

## 2015-03-14 MED ORDER — HEPARIN SODIUM (PORCINE) 5000 UNIT/ML IJ SOLN
5000.0000 [IU] | Freq: Three times a day (TID) | INTRAMUSCULAR | Status: DC
  Administered 2015-03-14 – 2015-03-25 (×31): 5000 [IU] via SUBCUTANEOUS
  Filled 2015-03-14 (×32): qty 1

## 2015-03-14 NOTE — Progress Notes (Signed)
Pt facility sheets indicate pt from [facility] [address] moved in date May 18 2014   Emergency contact listed as Melody Haver, daughter, as cell [phone] son in law joe vartanina [phone], Ryan Matto son [phone]   No family at bedside   Florentina Jenny listed on facility medication records as pcp EPIC updated
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
## 2015-03-14 NOTE — ED Notes (Signed)
Pt oxygen at 3l per [place] holding 88%.  Respiratory placing mask.  Sats improved.

## 2015-03-14 NOTE — ED Notes (Signed)
Patient is on their way upstairs.

## 2015-03-14 NOTE — ED Provider Notes (Signed)
CSN: [id number]     Arrival date & time 03/14/15  0701 History   First MD Initiated Contact with Patient 03/14/15 [phone]     Chief Complaint  Patient presents with  . Altered Mental Status     (Consider location/radiation/quality/duration/timing/severity/associated sxs/prior Treatment) HPI   Deanna Morgan is a 64 y.o. female who presents for evaluation of abdominal pain, hypotension and hypoxia. She was reported to be apneic intermittently during EMS transport. Patient arrives alert, complaining of bladder/abdominal pain. She has a chronic Foley catheterization. She is unable to give additional history.  Level V Caveat- Altered Mental Status   Past Medical History  Diagnosis Date  . Chronic pain   . Chronic constipation   . Chronic UTI   . Syringomyelia   . Paralysis   . Hyperlipidemia    No past surgical history on file. No family history on file. Social History  Substance Use Topics  . Smoking status: Not on file  . Smokeless tobacco: Not on file  . Alcohol Use: Not on file   OB History    No data available     Review of Systems  Unable to perform ROS     Allergies  Review of patient's allergies indicates no known allergies.  Home Medications   Prior to Admission medications   Medication Sig Start Date End Date Taking? Authorizing Provider  ALPRAZolam Prudy Feeler) 0.5 MG tablet Take 0.5 mg by mouth every 8 (eight) hours as needed for anxiety.   Yes Historical Provider, MD  Cranberry 425 MG CAPS Take 425 mg by mouth 2 (two) times daily.   Yes Historical Provider, MD  docusate sodium (COLACE) 100 MG capsule Take 100 mg by mouth 2 (two) times daily.   Yes Historical Provider, MD  Multiple Vitamin (DAILY VITE PO) Take 1 tablet by mouth daily.   Yes Historical Provider, MD  acidophilus (RISAQUAD) CAPS capsule Take 1 capsule by mouth 2 (two) times daily.    Historical Provider, MD  aspirin 81 MG tablet Take 81 mg by mouth daily.    Historical Provider, MD  baclofen  (LIORESAL) 20 MG tablet Take 20 mg by mouth 2 (two) times daily.    Historical Provider, MD  bisacodyl (LAXATIVE) 10 MG suppository Place 10 mg rectally as needed for mild constipation or moderate constipation.    Historical Provider, MD  Chloroxylenol-Zinc Oxide (BAZA EX) Apply 1 application topically 2 (two) times daily. Apply to buttocks until healed    Historical Provider, MD  clindamycin (CLEOCIN) 300 MG capsule Take 300 mg by mouth 3 (three) times daily.    Historical Provider, MD  fluocinonide cream (LIDEX) 0.05 % Apply 1 application topically daily. Apply to affected areas on scalp    Historical Provider, MD  ibuprofen (ADVIL,MOTRIN) 200 MG tablet Take 400 mg by mouth every 8 (eight) hours as needed for mild pain or moderate pain.    Historical Provider, MD  ketoconazole (NIZORAL) 2 % shampoo Apply 1 application topically 2 (two) times a week. Provide and apply on skin as directed. Lather and let sit for a few minutes prior to rinsing when showering.    Historical Provider, MD  Mineral Oil Heavy OIL Place 2 drops into both ears once a week.    Historical Provider, MD  morphine (MS CONTIN) 60 MG 12 hr tablet Take 60 mg by mouth 2 (two) times daily.    Historical Provider, MD  Omega 3 1000 MG CAPS Take 1,000 mg by mouth daily.  Historical Provider, MD  oxybutynin (DITROPAN XL) 15 MG 24 hr tablet Take 15 mg by mouth daily.    Historical Provider, MD  polyethylene glycol (MIRALAX / GLYCOLAX) packet Take 17 g by mouth daily as needed for mild constipation or moderate constipation. Mix 1 capful (17g) with 8 oz of fluid    Historical Provider, MD  saccharomyces boulardii (FLORASTOR) 250 MG capsule Take 250 mg by mouth 2 (two) times daily.    Historical Provider, MD  sertraline (ZOLOFT) 50 MG tablet Take 50 mg by mouth daily.    Historical Provider, MD  simvastatin (ZOCOR) 40 MG tablet Take 40 mg by mouth at bedtime.    Historical Provider, MD  sodium phosphate (FLEET) enema Place 1 enema rectally  daily as needed (constipation). follow package directions    Historical Provider, MD  vitamin C (ASCORBIC ACID) 500 MG tablet Take 500 mg by mouth 2 (two) times daily.    Historical Provider, MD   BP 78/59 mmHg  Pulse 74  Temp(Src) 97 F (36.1 C)  Resp 25  Ht 5\' 3"  (1.6 m)  Wt 138 lb (62.596 kg)  BMI 24.45 kg/m2  SpO2 98% Physical Exam  Constitutional: She appears well-developed.  Elderly, frail, appears older than stated age  HENT:  Head: Normocephalic and atraumatic.  Right Ear: External ear normal.  Left Ear: External ear normal.  Eyes: Conjunctivae and EOM are normal. Pupils are equal, round, and reactive to light.  Neck: Normal range of motion and phonation normal. Neck supple.  Cardiovascular: Normal rate, regular rhythm and normal heart sounds.   Pulmonary/Chest: She is in respiratory distress (mild increased work of breathing). She has no wheezes. She has no rales. She exhibits no bony tenderness.  Tachypneic. Decreased air mvt.  Abdominal: Soft. There is no tenderness.  Genitourinary:  Foley catheter draining clear urine.  Musculoskeletal:  No deformity of large joints. Mild peripheral edema.  Neurological: She is alert. No cranial nerve deficit or sensory deficit.  Quadriparetic  Skin: Skin is warm, dry and intact. No rash noted. No erythema.  Psychiatric:  She is lethargic  Nursing note and vitals reviewed.   ED Course  Procedures (including critical care time)  Initial Evaluation c/w undefined Sepsis. IVF 30cc/kg, and empiric ABX ordered.  Medications  sodium chloride 0.9 % bolus 1,000 mL (1,000 mLs Intravenous New Bag/Given 03/14/15 0748)  piperacillin-tazobactam (ZOSYN) IVPB 3.375 g (not administered)  vancomycin (VANCOCIN) IVPB 1000 mg/200 mL premix (1,000 mg Intravenous New Bag/Given 03/14/15 0813)    Followed by  vancomycin (VANCOCIN) 500 mg in sodium chloride 0.9 % 100 mL IVPB (not administered)  piperacillin-tazobactam (ZOSYN) IVPB 3.375 g (3.375 g  Intravenous New Bag/Given 03/14/15 0813)  morphine 2 MG/ML injection 2 mg (2 mg Intravenous Given 03/14/15 0818)    Patient Vitals for the past 24 hrs:  BP Temp Pulse Resp SpO2 Height Weight  03/14/15 0835 (!) 78/59 mmHg 97 F (36.1 C) 74 - 98 % - -  03/14/15 0832 (!) 81/54 mmHg 97 F (36.1 C) 73 - 96 % - -  03/14/15 0826 (!) 83/33 mmHg - 78 - 92 % - -  03/14/15 0825 - - 80 - (!) 86 % - -  03/14/15 0823 - - 83 - (!) 88 % - -  03/14/15 0820 105/78 mmHg - 91 - (!) 86 % - -  03/14/15 0815 102/67 mmHg - 91 - (!) 87 % - -  03/14/15 0805 91/74 mmHg - 72 25 97 % - -  03/14/15 0800 (!) 80/59 mmHg - 75 23 97 % - -  03/14/15 0755 (!) 70/46 mmHg - 73 22 98 % - -  03/14/15 0750 (!) 80/63 mmHg - 76 - 100 % - -  03/14/15 0746 (!) 72/54 mmHg - 82 18 97 %  (1.6 m) 138 lb (62.596 kg)  03/14/15 0740 (!) 58/48 mmHg - 68 18 98 % - -  03/14/15 0735 (!) 83/37 mmHg - 72 22 100 % - -  03/14/15 0732 (!) 60/26 mmHg - 74 19 100 % - -  03/14/15 0721 (!) 67/43 mmHg - 82 23 94 % - -  03/14/15 0705 (!) 139/124 mmHg - 79 - 95 % - -  03/14/15 0702 - - - - (!) 86 % - -   08:15- Case discussed with Cardiology, Dr. Eden Emms, who states will be involved as consultant and recommends admission by Critical Care.    8:37 AM Reevaluation with update and discussion. After initial assessment and treatment, an updated evaluation reveals patient states her pain is better, after initial dose of morphine. Her oxygen saturation instructed to the 80s on nasal cannula, so she has been placed on facemask oxygen. Currently, oxygenation is 97%. Blood pressure somewhat diminished after treatment with morphine. Patient remains alert. IVF and IV antibodies infusing. Kasen Adduci L   08:40 - PCCM consult- He will have Dr. Tyson Alias evaluate the patient.  Labs Review Labs Reviewed  CBC WITH DIFFERENTIAL/PLATELET - Abnormal; Notable for the following:    WBC 11.6 (*)    Neutro Abs 9.0 (*)    Monocytes Absolute 1.2 (*)    All other  components within normal limits  URINALYSIS, ROUTINE W REFLEX MICROSCOPIC (NOT AT [facility]) - Abnormal; Notable for the following:    APPearance TURBID (*)    Hgb urine dipstick LARGE (*)    Nitrite POSITIVE (*)    Leukocytes, UA LARGE (*)    All other components within normal limits  URINE MICROSCOPIC-ADD ON - Abnormal; Notable for the following:    Bacteria, UA MANY (*)    All other components within normal limits  I-STAT CHEM 8, ED - Abnormal; Notable for the following:    Sodium 133 (*)    Chloride 98 (*)    Glucose, Bld 124 (*)    Calcium, Ion 1.07 (*)    All other components within normal limits  I-STAT TROPOININ, ED - Abnormal; Notable for the following:    Troponin i, poc 9.96 (*)    All other components within normal limits  CULTURE, BLOOD (ROUTINE X 2)  CULTURE, BLOOD (ROUTINE X 2)  URINE CULTURE  I-STAT CG4 LACTIC ACID, ED  I-STAT CG4 LACTIC ACID, ED    CRITICAL CARE Performed by: Mancel Bale L Total critical care time: 50 minutes Critical care time was exclusive of separately billable procedures and treating other patients. Critical care was necessary to treat or prevent imminent or life-threatening deterioration. Critical care was time spent personally by me on the following activities: development of treatment plan with patient and/or surrogate as well as nursing, discussions with consultants, evaluation of patient's response to treatment, examination of patient, obtaining history from patient or surrogate, ordering and performing treatments and interventions, ordering and review of laboratory studies, ordering and review of radiographic studies, pulse oximetry and re-evaluation of patient's condition.   Imaging Review Dg Chest Port 1 View  03/14/2015   CLINICAL DATA:  Respiratory distress, hypoxia and hypotensive.  EXAM: PORTABLE CHEST - 1 VIEW  COMPARISON:  12/29/2014  FINDINGS: The cardiomediastinal silhouette is unremarkable.  Interstitial prominence now noted.   Focal opacity overlying the right lower lung is again noted.  There is no evidence of pneumothorax or definite pleural effusion.  Chronic dislocations of scratch de chronic bilateral shoulder dislocations noted with proximal right humeral prosthesis again identified.  No acute bony abnormalities are present.  IMPRESSION: Interstitial prominence which may represent interstitial edema or infection.  Persistent opacity overlying the right lower lung. If remote outside studies prior to 12/29/2014 cannot be obtained, recommend chest CT for further evaluation.  Chronic bilateral shoulder dislocations.   Electronically Signed   By: Harmon Pier M.D.   On: 03/14/2015 07:58   I have personally reviewed and evaluated these images and lab results as part of my medical decision-making.   EKG Interpretation   Date/Time:  Monday March 14 2015 07:53:56 EDT Ventricular Rate:  68 PR Interval:  137 QRS Duration: 98 QT Interval:  433 QTC Calculation: 460 R Axis:   -51 Text Interpretation:  Sinus rhythm LAD, consider left anterior fascicular  block Low voltage, precordial leads Borderline T abnormalities, inferior  leads No old tracing to compare Confirmed by [facility]  MD, Amoreena Neubert [phone]) on  03/14/2015 7:56:37 AM      MDM   Final diagnoses:  Hypotension, unspecified hypotension type  Hypoxia  NSTEMI (non-ST elevated myocardial infarction)  Abdominal pain, acute  Chronic pain  Quadriparesis    AMS with abdominal pain, is hypotensive, and has markedly elevated troponin. EKG does not indicate acute infarct. Initial screening for source of infection is unrevealing. 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 Patient has low urinary retention, possibly causing some abdominal pain. However, her pain continued after her Foley catheter was replaced. She does have chronic pain, as well as chronic constipation. She has been treated with empiric ABX, and high volume fluid resuscitation. Blood pressure trended  better with initial treatment.  Nursing Notes Reviewed/ Care Coordinated Applicable Imaging Reviewed Interpretation of Laboratory Data incorporated into ED treatment  Plan: Admit    Mancel Bale, MD 03/15/15 [phone]

## 2015-03-14 NOTE — Progress Notes (Signed)
eLink Physician-Brief Progress Note Patient Name: Deanna Morgan DOB: [birth year] MRN: [id number]   Date of Service  03/14/2015  HPI/Events of Note  No effect of soap suds enema Severe anxiety per RN Can take PO  eICU Interventions  Lactulose x1  Resume xanax -home med     Intervention Category Intermediate Interventions: Other:;Medication change / dose adjustment  Taeya Theall V. 03/14/2015, 4:28 PM
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
## 2015-03-14 NOTE — ED Notes (Signed)
Dr Effie Shy notified of critical Istat Troponin (9.96)

## 2015-03-14 NOTE — Consult Note (Signed)
CARDIOLOGY CONSULT NOTE   Patient ID: Deanna Morgan MRN: [id number], DOB/AGE: 65-Feb-1952   Admit date: 03/14/2015 Date of Consult: 03/14/2015   Primary Physician: Pcp Not In System Primary Cardiologist: None  Pt. Profile  64 year old woman admitted as an emergency from [facility] with altered level of responsiveness.  Found to have elevated troponin.  Problem List  Past Medical History  Diagnosis Date  . Chronic pain   . Chronic constipation   . Chronic UTI   . Syringomyelia   . Paralysis   . Hyperlipidemia     No past surgical history on file.   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  She is a resident of [facility] nursing home.  Today EMS responded to skilled nursing facility after BLS strep found patient to be responsive to name but altered mentation, pale diaphoretic and tachypnea.  She was hypotensive.  She improved on nonrebreather.  She became alert and oriented 4.  There was initial difficulty placing IV access. The patient does not have any history of known heart disease.  She is not on any heart or blood pressure medication except for statin therapy in the form of simvastatin 40 mg daily.  She does take a baby aspirin daily. She denies any chest pain.  She complains of pain in the back of her neck which is chronic. She does complain of abdominal discomfort and abdominal fullness. Her initial EKG shows normal sinus rhythm and no ischemic changes.  Initial troponin is 9.96.  Chest x-ray shows normal heart size.  There is questionable right lower lobe infiltrate as well as diffusely increased intravascular markings.  Initial EKG shows normal sinus rhythm and left axis deviation and incomplete right bundle branch block and nonspecific inferior wall T-wave changes but no acute changes of ischemia or injury. Inpatient Medications  . aspirin  324 mg Oral NOW   Or  . aspirin  300  mg Rectal NOW  . heparin  5,000 Units Subcutaneous 3 times per day  . insulin aspart  0-9 Units Subcutaneous 6 times per day  . pantoprazole (PROTONIX) IV  40 mg Intravenous QHS    Family History No family history on file.   Social History Social History   Social History  . Marital Status: Single    Spouse Name: N/A  . Number of Children: N/A  . Years of Education: N/A   Occupational History  . Not on file.   Social History Main Topics  . Smoking status: Not on file  . Smokeless tobacco: Not on file  . Alcohol Use: Not on file  . Drug Use: Not on file  . Sexual Activity: Not on file   Other Topics Concern  . Not on file   Social History Narrative     Review of Systems  General:  No chills, fever, night sweats or weight changes.  Cardiovascular:  No chest pain, dyspnea on exertion, edema, orthopnea, palpitations, paroxysmal nocturnal dyspnea. Dermatological: No rash, lesions/masses Respiratory: No cough, dyspnea Urologic: No hematuria, dysuria Abdominal:   Positive for abdominal discomfort and fullness Neurologic:  No visual changes, wkns, changes in mental status. All other systems reviewed and are otherwise negative except as noted above.  Physical Exam  Blood pressure 99/32, pulse 77, temperature 97 F (36.1 C), resp. rate 26, height  (1.6 m), weight 138 lb (62.596 kg), SpO2 83 %.  General: Pleasant, NAD Psych: Normal affect.  Neuro: Alert and oriented X 3.  Quadriparesis HEENT: Normal  Neck: Supple without bruits or JVD. Lungs:  Rhonchi right base Heart: RRR no s3, s4, or murmurs.  [place]-A-Cath site in right upper chest  Abdomen: Moderately distended.  Patient states nontender.  Bowel sounds are markedly diminished Extremities: No clubbing, cyanosis or edema. DP/PT/Radials 2+ and equal bilaterally.  Labs  No results for input(s): CKTOTAL, CKMB, TROPONINI in the last 72 hours. Lab Results  Component Value Date   WBC 11.6* 03/14/2015   HGB 13.9  03/14/2015   HCT 41.0 03/14/2015   MCV 87.2 03/14/2015   PLT 225 03/14/2015     Recent Labs Lab 03/14/15 0741  NA 133*  K 3.5  CL 98*  BUN 19  CREATININE 0.70  GLUCOSE 124*   No results found for: CHOL, HDL, LDLCALC, TRIG No results found for: DDIMER  Radiology/Studies  Dg Chest Port 1 View  03/14/2015   CLINICAL DATA:  Respiratory distress, hypoxia and hypotensive.  EXAM: PORTABLE CHEST - 1 VIEW  COMPARISON:  12/29/2014  FINDINGS: The cardiomediastinal silhouette is unremarkable.  Interstitial prominence now noted.  Focal opacity overlying the right lower lung is again noted.  There is no evidence of pneumothorax or definite pleural effusion.  Chronic dislocations of scratch de chronic bilateral shoulder dislocations noted with proximal right humeral prosthesis again identified.  No acute bony abnormalities are present.  IMPRESSION: Interstitial prominence which may represent interstitial edema or infection.  Persistent opacity overlying the right lower lung. If remote outside studies prior to 12/29/2014 cannot be obtained, recommend chest CT for further evaluation.  Chronic bilateral shoulder dislocations.   Electronically Signed   By: Harmon Pier M.D.   On: 03/14/2015 07:58    ECG  Normal sinus rhythm.  No acute ischemic changes.  Incomplete right bundle branch block.  Left axis deviation.  Nonspecific T wave changes.  Personally reviewed  ASSESSMENT AND PLAN 1.  Possible right lower lobe pneumonia.  Possible sepsis. 2.  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 3.  Hypotension probably secondary to #1 4.  Elevated troponin uncertain etiology.  Possible stress cardiomyopathy secondary to sepsis.  Rule out pulmonary embolus, although lack of tachycardia would be unusual. 5.  History of hypercholesterolemia, on statin therapy  Plan: Treatment of sepsis and hypotension as per CCM We will get an echocardiogram to look at LV function.  Her CK and CK-MB levels will be assessed as well  as follow-up troponins and follow-up EKGs. Will follow with you.  Karie Schwalbe MD  03/14/2015, 9:19 AM

## 2015-03-14 NOTE — ED Notes (Signed)
Pt is from [facility]. EMS responded to SNF after BLS truck found pt to be responsive to name but altered mentation, pale, diaphoretic and tachypnea. BLS couldn't get a reading of O2 sats. Initial EMS BP was 50/30s. EMS attempted to place IV without success. Placed pt on non-rebreather and pt became a&o x4. In route pt was apneic when she would dose off. BP was 108 systolically when placed in trendelenburg. Pt reported to EMS that she was "sick in the past 24 hrs".

## 2015-03-14 NOTE — ED Notes (Signed)
Bed: RESB Expected date:  Expected time:  Means of arrival:  Comments: EMS code sepsis/current hypotension

## 2015-03-14 NOTE — Progress Notes (Signed)
eLink Physician-Brief Progress Note Patient Name: Nyara Border DOB: [birth year] MRN: [id number]   Date of Service  03/14/2015  HPI/Events of Note  Low BP, nml mentation, nml lactate x2  eICU Interventions  NS bolus 500     Intervention Category Intermediate Interventions: Hypotension - evaluation and management  Elio Haden V. 03/14/2015, 3:53 PM

## 2015-03-15 ENCOUNTER — Inpatient Hospital Stay (HOSPITAL_COMMUNITY): Payer: Medicare Other

## 2015-03-15 ENCOUNTER — Encounter (HOSPITAL_COMMUNITY): Payer: Self-pay | Admitting: *Deleted

## 2015-03-15 DIAGNOSIS — R0600 Dyspnea, unspecified: Secondary | ICD-10-CM

## 2015-03-15 DIAGNOSIS — R748 Abnormal levels of other serum enzymes: Secondary | ICD-10-CM

## 2015-03-15 LAB — BASIC METABOLIC PANEL
ANION GAP: 11 (ref 5–15)
BUN: 15 mg/dL (ref 6–20)
CO2: 20 mmol/L — AB (ref 22–32)
Calcium: 8.5 mg/dL — ABNORMAL LOW (ref 8.9–10.3)
Chloride: 105 mmol/L (ref 101–111)
Creatinine, Ser: 0.58 mg/dL (ref 0.44–1.00)
GFR calc Af Amer: >60 mL/min (ref >60)
GLUCOSE: 166 mg/dL — AB (ref 65–99)
POTASSIUM: 3.8 mmol/L (ref 3.5–5.1)
Sodium: 136 mmol/L (ref 135–145)

## 2015-03-15 LAB — GLUCOSE, CAPILLARY
GLUCOSE-CAPILLARY: 106 mg/dL — AB (ref 65–99)
GLUCOSE-CAPILLARY: 122 mg/dL — AB (ref 65–99)
GLUCOSE-CAPILLARY: 146 mg/dL — AB (ref 65–99)
Glucose-Capillary: 136 mg/dL — ABNORMAL HIGH (ref 65–99)
Glucose-Capillary: 153 mg/dL — ABNORMAL HIGH (ref 65–99)
Glucose-Capillary: 154 mg/dL — ABNORMAL HIGH (ref 65–99)

## 2015-03-15 LAB — URINE CULTURE

## 2015-03-15 LAB — PROCALCITONIN: Procalcitonin: <0.1 ng/mL

## 2015-03-15 MED ORDER — LACTULOSE 10 GM/15ML PO SOLN
30.0000 g | Freq: Once | ORAL | Status: AC
  Administered 2015-03-15: 30 g via ORAL
  Filled 2015-03-15: qty 45

## 2015-03-15 MED ORDER — MORPHINE SULFATE (PF) 2 MG/ML IV SOLN
1.0000 mg | INTRAVENOUS | Status: DC | PRN
  Administered 2015-03-15: 2 mg via INTRAVENOUS
  Filled 2015-03-15: qty 1

## 2015-03-15 MED ORDER — BACLOFEN 10 MG PO TABS
10.0000 mg | ORAL_TABLET | Freq: Two times a day (BID) | ORAL | Status: DC
  Administered 2015-03-15 – 2015-03-17 (×5): 10 mg via ORAL
  Filled 2015-03-15 (×5): qty 1

## 2015-03-15 MED ORDER — MORPHINE SULFATE (PF) 2 MG/ML IV SOLN
1.0000 mg | INTRAVENOUS | Status: DC | PRN
Start: 2015-03-15 — End: 2015-03-18
  Administered 2015-03-15 – 2015-03-18 (×19): 2 mg via INTRAVENOUS
  Filled 2015-03-15 (×20): qty 1

## 2015-03-15 NOTE — Evaluation (Signed)
SLP Cancellation Note  Patient Details Name: Deanna Morgan MRN: [id number] DOB: [birth year]   Cancelled treatment:       Reason Eval/Treat Not Completed: Medical issues which prohibited therapy (per RN, pt tachypenic and not appropriate for evaluation)  Donavan Burnet, MS [facility] SLP [phone]
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
## 2015-03-15 NOTE — Progress Notes (Signed)
Patient Name: Deanna Morgan Date of Encounter: 03/15/2015  Primary Cardiologist: Dr. Patty Sermons   Active Problems:   Aspiration pneumonia   Abdominal pain, acute   Arterial hypotension   Hypoxia   Ileus    SUBJECTIVE  Has pain everywhere per patient. States she can't breathe, no improvement in breathing since arrival.   CURRENT MEDS . antiseptic oral rinse  7 mL Mouth Rinse q12n4p  . chlorhexidine  15 mL Mouth Rinse BID  . heparin  5,000 Units Subcutaneous 3 times per day  . insulin aspart  0-9 Units Subcutaneous 6 times per day  . pantoprazole (PROTONIX) IV  40 mg Intravenous QHS  . piperacillin-tazobactam (ZOSYN)  IV  3.375 g Intravenous 3 times per day  . vancomycin  500 mg Intravenous Q12H    OBJECTIVE  Filed Vitals:   03/15/15 0400 03/15/15 0413 03/15/15 0600 03/15/15 0700  BP: 106/62  149/116 170/134  Pulse: 92  94 95  Temp: 97.7 F (36.5 C)  97.9 F (36.6 C) 98.1 F (36.7 C)  TempSrc:      Resp: 27  31 34  Height:      Weight:  154 lb 1.6 oz (69.9 kg)    SpO2: 94%  95% 94%    Intake/Output Summary (Last 24 hours) at 03/15/15 0750 Last data filed at 03/15/15 0700  Gross per 24 hour  Intake   4095 ml  Output    685 ml  Net   3410 ml   Filed Weights   03/14/15 0746 03/14/15 1300 03/15/15 0413  Weight: 138 lb (62.596 kg) 151 lb 3.8 oz (68.6 kg) 154 lb 1.6 oz (69.9 kg)    PHYSICAL EXAM  General: on oxygen mask, receiving breathing treatment, appears to be uncomfortable. Neuro: Alert. Moves all extremities spontaneously. Psych: Normal affect. HEENT:  Normal  Neck: Supple without bruits or JVD. Lungs:  Resp regular and unlabored. Anterior exam CTA Heart: tachycardic. no s3, s4, or murmurs. Abdomen: Soft, non-tender, non-distended, BS + x 4.  Extremities: No clubbing, cyanosis or edema. DP/PT/Radials 2+ and equal bilaterally.  Accessory Clinical Findings  CBC  Recent Labs  03/14/15 0728 03/14/15 0741  WBC 11.6*  --   NEUTROABS 9.0*  --     HGB 12.8 13.9  HCT 39.4 41.0  MCV 87.2  --   PLT 225  --    Basic Metabolic Panel  Recent Labs  03/14/15 0728 03/14/15 0741  NA  --  133*  K  --  3.5  CL  --  98*  GLUCOSE  --  124*  BUN  --  19  CREATININE 0.76 0.70   Liver Function Tests  Recent Labs  03/14/15 0728  AST 71*  ALT 21  ALKPHOS 55  BILITOT 0.7  PROT 7.0  ALBUMIN 3.8    Recent Labs  03/14/15 0728  LIPASE 12*  AMYLASE 35   Cardiac Enzymes  Recent Labs  03/14/15 0728 03/14/15 1400 03/14/15 2110  CKTOTAL 532*  --   --   TROPONINI 8.04* 5.52* 5.24*   Thyroid Function Tests  Recent Labs  03/14/15 0728  TSH 3.056    TELE Sinus tach with HR 80-90s    ECG  NSR with L axis deviation, no obvious ST-T wave changes  Echocardiogram  pending    Radiology/Studies  Dg Chest Port 1 View  03/14/2015   CLINICAL DATA:  Respiratory distress, hypoxia and hypotensive.  EXAM: PORTABLE CHEST - 1 VIEW  COMPARISON:  12/29/2014  FINDINGS:  The cardiomediastinal silhouette is unremarkable.  Interstitial prominence now noted.  Focal opacity overlying the right lower lung is again noted.  There is no evidence of pneumothorax or definite pleural effusion.  Chronic dislocations of scratch de chronic bilateral shoulder dislocations noted with proximal right humeral prosthesis again identified.  No acute bony abnormalities are present.  IMPRESSION: Interstitial prominence which may represent interstitial edema or infection.  Persistent opacity overlying the right lower lung. If remote outside studies prior to 12/29/2014 cannot be obtained, recommend chest CT for further evaluation.  Chronic bilateral shoulder dislocations.   Electronically Signed   By: Harmon Pier M.D.   On: 03/14/2015 07:58   Dg Abd Portable 1v  03/14/2015   CLINICAL DATA:  Diffuse abdominal pain.  Chronic constipation.  EXAM: PORTABLE ABDOMEN - 1 VIEW  COMPARISON:  None.  FINDINGS: Normal bowel gas pattern. Prominent stool throughout the colon.  Probable rectal temperature probe. Lumbar spine rotary scoliosis. Diffuse osteopenia.  IMPRESSION: Prominent stool throughout the colon.   Electronically Signed   By: Beckie Salts M.D.   On: 03/14/2015 09:40    ASSESSMENT AND PLAN  64 year old woman admitted as an emergency from [facility] with altered level of responsiveness. Found to have elevated troponin.  1. Elevated trop, unclear if stress induced cardiomyopathy vs ACS  - echo pending to look at LV function and wall motion  - trop higher than expected given normal renal function, trop 8.04 --> 5.52 --> 5.24. Initial lactic acid 1.56, negative procalcitonin noted. Positive UTI, RLL infiltrated noted. Her pain is very atypical and everywhere per patient.  - if EF and wall motion normal, may consider myoview later, if abnormal, will consider cath later once her condition improve. Still very much short of breath this morning, struggling to get air in. Would not be good candidate for any ischemic workup right now. Recommend r/o PE.   2. Possible RLL PNA  - NPO to avoid aspiration, pending eval by speech  3. Hypotension 2/2 #2  4. 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  5. History of hypercholesterolemia, on statin therapy  Signed, Amedeo Plenty Pager: [phone]  I have seen and examined the patient along with Azalee Course PA-C.  I have reviewed the chart, notes and new data.  I agree with PA's note.  PLAN:  Echo appearance is highly suggestive of Takotsubo cardiomyopathy. A definitive distinction between Takotsubo syndrome and true acute coronary insufficiency cannot be made without coronary angiography, but I am doubtful that invasive procedures are really the right thing to do for this patient with severe limitations in functional status and other acute comorbid conditions. Rather, would recommend repeat evaluation with echo in roughly one week, by which point most cases of stress cardiomyopathy will be showing  improvement. Meanwhile, she is at substantial risk for acute heart failure/volume overload. IV fluids should be used judiciously. Watch for sodium load with Zosyn. Monitor for signs/symptoms of hypervolemia. Recheck ECG - may show anterior deep T wave inversion and long QT.  Thurmon Fair, MD, [facility] [phone] 03/15/2015, 2:58 PM

## 2015-03-15 NOTE — Progress Notes (Signed)
Patient given a bath and feet are discolored and cool.  R foot is slightly cyanotic and r leg is slightly mottled.  Dorsalis pedis dopplered on both feet.  Dr. Dema Severin and Canary Brim NP both looked at the feet.  Continue to monitor the patient closely.  Maicy Filip Debroah Loop RN

## 2015-03-15 NOTE — Progress Notes (Signed)
  Echocardiogram 2D Echocardiogram has been performed.  Janalyn Harder 03/15/2015, 8:50 AM

## 2015-03-15 NOTE — Care Management Note (Signed)
Case Management Note  Patient Details  Name: Deanna Morgan MRN: [id number] Date of Birth: 1951-02-27  Subjective/Objective:         resp failure and hypotension           Action/Plan:Date:  Sept.6, 2016 U.R. performed for needs and level of care. Will continue to follow for Case Management needs.  Marcelle Smiling, RN, BSN, [place]   [phone]   Expected Discharge Date:                  Expected Discharge Plan:  Home/Self Care  In-House Referral:  NA  Discharge planning Services  CM Consult  Post Acute Care Choice:  NA Choice offered to:  NA  DME Arranged:    DME Agency:     HH Arranged:    HH Agency:     Status of Service:  Completed, signed off  Medicare Important Message Given:    Date Medicare IM Given:    Medicare IM give by:    Date Additional Medicare IM Given:    Additional Medicare Important Message give by:     If discussed at Long Length of Stay Meetings, dates discussed:    Additional Comments:  Ridhima, Demerath, RN 03/15/2015, 2:13 PM
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
## 2015-03-16 ENCOUNTER — Inpatient Hospital Stay (HOSPITAL_COMMUNITY): Payer: Medicare Other

## 2015-03-16 DIAGNOSIS — I5041 Acute combined systolic (congestive) and diastolic (congestive) heart failure: Secondary | ICD-10-CM

## 2015-03-16 LAB — CBC
HCT: 38.2 % (ref 36.0–46.0)
HEMOGLOBIN: 12.1 g/dL (ref 12.0–15.0)
MCH: 28.5 pg (ref 26.0–34.0)
MCHC: 31.7 g/dL (ref 30.0–36.0)
MCV: 90.1 fL (ref 78.0–100.0)
PLATELETS: 166 10*3/uL (ref 150–400)
RBC: 4.24 MIL/uL (ref 3.87–5.11)
RDW: 13.9 % (ref 11.5–15.5)
WBC: 10.2 10*3/uL (ref 4.0–10.5)

## 2015-03-16 LAB — BLOOD GAS, ARTERIAL
Acid-Base Excess: 0.3 mmol/L (ref 0.0–2.0)
Bicarbonate: 23.2 mEq/L (ref 20.0–24.0)
DRAWN BY: 422461
FIO2: 0.5
O2 CONTENT: 12 L/min
O2 Saturation: 97.4 %
Patient temperature: 98.6
TCO2: 20.9 mmol/L (ref 0–100)
pCO2 arterial: 33.2 mmHg — ABNORMAL LOW (ref 35.0–45.0)
pH, Arterial: 7.459 — ABNORMAL HIGH (ref 7.350–7.450)
pO2, Arterial: 87.8 mmHg (ref 80.0–100.0)

## 2015-03-16 LAB — GLUCOSE, CAPILLARY
GLUCOSE-CAPILLARY: 119 mg/dL — AB (ref 65–99)
GLUCOSE-CAPILLARY: 120 mg/dL — AB (ref 65–99)
GLUCOSE-CAPILLARY: 128 mg/dL — AB (ref 65–99)
GLUCOSE-CAPILLARY: 137 mg/dL — AB (ref 65–99)
Glucose-Capillary: 99 mg/dL (ref 65–99)
Glucose-Capillary: 110 mg/dL — ABNORMAL HIGH (ref 65–99)

## 2015-03-16 LAB — PHOSPHORUS: Phosphorus: 2.3 mg/dL — ABNORMAL LOW (ref 2.5–4.6)

## 2015-03-16 LAB — BASIC METABOLIC PANEL
ANION GAP: 9 (ref 5–15)
BUN: 15 mg/dL (ref 6–20)
CHLORIDE: 106 mmol/L (ref 101–111)
CO2: 21 mmol/L — ABNORMAL LOW (ref 22–32)
Calcium: 8.8 mg/dL — ABNORMAL LOW (ref 8.9–10.3)
Creatinine, Ser: 0.54 mg/dL (ref 0.44–1.00)
GFR calc Af Amer: >60 mL/min (ref >60)
GLUCOSE: 129 mg/dL — AB (ref 65–99)
POTASSIUM: 4.1 mmol/L (ref 3.5–5.1)
Sodium: 136 mmol/L (ref 135–145)

## 2015-03-16 LAB — PROCALCITONIN: Procalcitonin: <0.1 ng/mL

## 2015-03-16 LAB — MAGNESIUM: MAGNESIUM: 2.2 mg/dL (ref 1.7–2.4)

## 2015-03-16 MED ORDER — DOCUSATE SODIUM 100 MG PO CAPS
100.0000 mg | ORAL_CAPSULE | Freq: Every day | ORAL | Status: DC
  Administered 2015-03-17 – 2015-03-19 (×3): 100 mg via ORAL
  Filled 2015-03-16 (×4): qty 1

## 2015-03-16 MED ORDER — FUROSEMIDE 10 MG/ML IJ SOLN
40.0000 mg | Freq: Once | INTRAMUSCULAR | Status: AC
  Administered 2015-03-16: 40 mg via INTRAVENOUS
  Filled 2015-03-16: qty 4

## 2015-03-16 MED ORDER — BISACODYL 10 MG RE SUPP
10.0000 mg | Freq: Every day | RECTAL | Status: DC | PRN
Start: 2015-03-16 — End: 2015-03-21
  Administered 2015-03-16 – 2015-03-18 (×2): 10 mg via RECTAL
  Filled 2015-03-16 (×2): qty 1

## 2015-03-16 MED ORDER — FUROSEMIDE 40 MG PO TABS
40.0000 mg | ORAL_TABLET | Freq: Two times a day (BID) | ORAL | Status: DC
  Administered 2015-03-16 – 2015-03-20 (×8): 40 mg via ORAL
  Filled 2015-03-16 (×9): qty 1

## 2015-03-16 MED ORDER — LISINOPRIL 10 MG PO TABS
10.0000 mg | ORAL_TABLET | Freq: Every day | ORAL | Status: DC
  Administered 2015-03-16 – 2015-03-17 (×2): 10 mg via ORAL
  Filled 2015-03-16 (×2): qty 1

## 2015-03-16 MED ORDER — BACLOFEN 10 MG PO TABS
10.0000 mg | ORAL_TABLET | Freq: Once | ORAL | Status: AC
  Administered 2015-03-16: 10 mg via ORAL
  Filled 2015-03-16: qty 1

## 2015-03-16 MED ORDER — LACTULOSE 10 GM/15ML PO SOLN
20.0000 g | Freq: Two times a day (BID) | ORAL | Status: DC
  Administered 2015-03-16: 20 g via ORAL
  Administered 2015-03-16: 10 g via ORAL
  Administered 2015-03-17 – 2015-03-19 (×4): 20 g via ORAL
  Filled 2015-03-16 (×7): qty 30

## 2015-03-16 NOTE — Progress Notes (Signed)
eLink Physician-Brief Progress Note Patient Name: Deanna Morgan DOB: 27-Apr-1951 MRN: [id number]   Date of Service  03/16/2015  HPI/Events of Note  Patient having muscle spasms. Nurse requests a one time extra dose of Baclafen. Patient lethargic. ABG >> 7.45/33.2/87./8/23.2.   eICU Interventions  Will order: 1. Baclofen 10 mg PO now.      Intervention Category Intermediate Interventions: Other:  Lenell Antu 03/16/2015, 7:58 PM

## 2015-03-16 NOTE — Progress Notes (Signed)
   03/16/15 1100  Clinical Encounter Type  Visited With Family  Visit Type Initial  Referral From Nurse  Consult/Referral To Chaplain  Spiritual Encounters  Spiritual Needs Emotional;Other (Comment) Veterinary surgeon )  Stress Factors  Family Stress Factors Health changes   Chaplain visited with the patient and her daughter per request by the Charge Nurse. The patient was asleep upon the Chaplains arrival. The daughter was at her bedside.  The family and patient have good emotional and spiritual support. The patient's clergy came by yesterday to visit with them.  The daughter expressed her relief that her mother was able to sleep. The daughter did not express any needs at this time. Chaplain interventions were pastoral conversation and emotional support.

## 2015-03-16 NOTE — Progress Notes (Signed)
Patient Name: Deanna Morgan Date of Encounter: 03/16/2015  Primary Cardiologist: Dr. Patty Sermons   Active Problems:   Aspiration pneumonia   Abdominal pain, acute   Arterial hypotension   Hypoxia   Ileus    SUBJECTIVE  Drowsy. Did not respond to question, appears to be very weak. Does follow command.     CURRENT MEDS . antiseptic oral rinse  7 mL Mouth Rinse q12n4p  . baclofen  10 mg Oral BID  . chlorhexidine  15 mL Mouth Rinse BID  . heparin  5,000 Units Subcutaneous 3 times per day  . insulin aspart  0-9 Units Subcutaneous 6 times per day  . pantoprazole (PROTONIX) IV  40 mg Intravenous QHS  . piperacillin-tazobactam (ZOSYN)  IV  3.375 g Intravenous 3 times per day  . vancomycin  500 mg Intravenous Q12H    OBJECTIVE  Filed Vitals:   03/16/15 0300 03/16/15 0400 03/16/15 0500 03/16/15 0600  BP: 132/90 150/107 159/117 118/19  Pulse: 81 82 88 97  Temp: 97.2 F (36.2 C) 97 F (36.1 C) 96.6 F (35.9 C) 97 F (36.1 C)  TempSrc:      Resp: Height:      Weight:      SpO2: 93% 93% 88% 90%    Intake/Output Summary (Last 24 hours) at 03/16/15 0755 Last data filed at 03/16/15 0647  Gross per 24 hour  Intake   2315 ml  Output    610 ml  Net   1705 ml   Filed Weights   03/14/15 0746 03/14/15 1300 03/15/15 0413  Weight: 138 lb (62.596 kg) 151 lb 3.8 oz (68.6 kg) 154 lb 1.6 oz (69.9 kg)    PHYSICAL EXAM  General: on oxygen mask, drowsy, does open eye to command and occasionally speak a few works Neuro: Data processing manager. Psych: unable to assess due to clinical condition HEENT:  Normal  Neck: Supple without bruits. Significant muscle contraction during inspiration Lungs: Appears to be very weak and using accessory muscles to breath. Anterior exam crackles noted esp on R side Heart: RRR. no s3, s4, or murmurs. Abdomen: Soft, non-tender, non-distended, BS + x 4.  Extremities: DP/PT/Radials 2+ and equal bilaterally. R forearm cold, feet also cold but has strong  DP pulse.  Accessory Clinical Findings  CBC  Recent Labs  03/14/15 0728 03/14/15 0741 03/16/15 0335  WBC 11.6*  --  10.2  NEUTROABS 9.0*  --   --   HGB 12.8 13.9 12.1  HCT 39.4 41.0 38.2  MCV 87.2  --  90.1  PLT 225  --  166   Basic Metabolic Panel  Recent Labs  03/15/15 1135 03/16/15 0335  NA 136 136  K 3.8 4.1  CL 105 106  CO2 20* 21*  GLUCOSE 166* 129*  BUN 15 15  CREATININE 0.58 0.54  CALCIUM 8.5* 8.8*  MG  --  2.2  PHOS  --  2.3*   Liver Function Tests  Recent Labs  03/14/15 0728  AST 71*  ALT 21  ALKPHOS 55  BILITOT 0.7  PROT 7.0  ALBUMIN 3.8    Recent Labs  03/14/15 0728  LIPASE 12*  AMYLASE 35   Cardiac Enzymes  Recent Labs  03/14/15 0728 03/14/15 1400 03/14/15 2110  CKTOTAL 532*  --   --   TROPONINI 8.04* 5.52* 5.24*   Thyroid Function Tests  Recent Labs  03/14/15 0728  TSH 3.056    TELE NSR with HR 80-90s  ECG  No new EKG  Echocardiogram 03/15/2015  LV EF: 30% -  35%  ------------------------------------------------------------------- Indications:   Dyspnea 786.09.  ------------------------------------------------------------------- History:  PMH: No prior cardiac history.  ------------------------------------------------------------------- Study Conclusions  - Left ventricle: The cavity size was normal. Wall thickness was increased in a pattern of mild LVH. Systolic function was moderately to severely reduced. The estimated ejection fraction was in the range of 30% to 35%. There is akinesis of the mid-apicalanteroseptal, lateral, and inferior myocardium. Consider Taketsubo cardiomyopathy (stress induced cardiomyopathy) if ischemia is excluded. Doppler parameters are consistent with abnormal left ventricular relaxation (grade 1 diastolic dysfunction). - Pulmonary arteries: Systolic pressure was mildly increased. PA peak pressure: 43 mm Hg (S).    Radiology/Studies  Dg Chest  Port 1 View  03/16/2015   CLINICAL DATA:  Acute respiratory failure with hypoxemia  EXAM: PORTABLE CHEST - 1 VIEW  COMPARISON:  03/14/2015  FINDINGS: Progression of bilateral airspace disease which is symmetric. Progression of bilateral effusion and bibasilar atelectasis. Findings are most consistent with congestive heart failure.  Bilateral shoulder subluxation.  Right shoulder prosthesis.  IMPRESSION: Progression of bilateral airspace disease and bilateral effusions. Findings most consistent with congestive heart failure with edema. Progression of bibasilar atelectasis.   Electronically Signed   By: Marlan Palau M.D.   On: 03/16/2015 07:08   Dg Chest Port 1 View  03/14/2015   CLINICAL DATA:  Respiratory distress, hypoxia and hypotensive.  EXAM: PORTABLE CHEST - 1 VIEW  COMPARISON:  12/29/2014  FINDINGS: The cardiomediastinal silhouette is unremarkable.  Interstitial prominence now noted.  Focal opacity overlying the right lower lung is again noted.  There is no evidence of pneumothorax or definite pleural effusion.  Chronic dislocations of scratch de chronic bilateral shoulder dislocations noted with proximal right humeral prosthesis again identified.  No acute bony abnormalities are present.  IMPRESSION: Interstitial prominence which may represent interstitial edema or infection.  Persistent opacity overlying the right lower lung. If remote outside studies prior to 12/29/2014 cannot be obtained, recommend chest CT for further evaluation.  Chronic bilateral shoulder dislocations.   Electronically Signed   By: Harmon Pier M.D.   On: 03/14/2015 07:58   Dg Abd Portable 1v  03/14/2015   CLINICAL DATA:  Diffuse abdominal pain.  Chronic constipation.  EXAM: PORTABLE ABDOMEN - 1 VIEW  COMPARISON:  None.  FINDINGS: Normal bowel gas pattern. Prominent stool throughout the colon. Probable rectal temperature probe. Lumbar spine rotary scoliosis. Diffuse osteopenia.  IMPRESSION: Prominent stool throughout the colon.    Electronically Signed   By: Beckie Salts M.D.   On: 03/14/2015 09:40    ASSESSMENT AND PLAN  64 year old woman admitted as an emergency from [facility] with altered level of responsiveness. Found to have elevated troponin.  1. Elevated trop, unclear if stress induced cardiomyopathy vs ACS  - echo pending to look at LV function and wall motion  - trop higher than expected given normal renal function, trop 8.04 --> 5.52 --> 5.24. Initial lactic acid 1.56, negative procalcitonin noted. Positive UTI, RLL infiltrated noted. Her pain is very atypical and everywhere per patient.  2. 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 No good candidate for invasive workup now, repeat echo in 1 week to see if has any improvement.  - with new LV dysfunction, high risk of acute HF  3. Acute systolic HF  - increasing SOB this morinng, CXR concerning  for pulm edema, exam shows crackles on the R side. Will give 40mg  IV lasix x 1 to see response. Patient appears to be very drowsy this morning, almost somnolent during exam. Very concerned that she may need to be intubated. Hopefully with breathing treatment and lasix, her symptom will improve. May need additional lasix depend on response to the first dose. D/C'ed IVF  4. Possible RLL PNA  - NPO to avoid aspiration, pending eval by speech  5. Hypotension 2/2 #2  6. 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  7. History of hypercholesterolemia, on statin therapy  Signed, Amedeo Plenty Pager: [phone]  I have seen and examined the patient along with Azalee Course, PA.  I have reviewed the chart, notes and new data.  I agree with PA's note.  Key new complaints: she is substantially more dyspneic Key examination changes: rapid shallow breathing, a few rales Key new findings / data: moderate response to diuretics so far, BP  high  PLAN: Will start ACEinh and place on scheduled diuretics until anticipated improvement in LVEF towards the end of the week. Despite anticipated recovery, prognosis is very guarded due to underlying neurological problems and secondary ventilatory abnormalities as well as superimposed pneumonia.  Thurmon Fair, MD, [facility] [phone] 03/16/2015, 1:18 PM
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
## 2015-03-16 NOTE — Clinical Documentation Improvement (Signed)
Critical Care  Can the diagnosis of altered mental status be further specified?   Encephalopathy - Alcoholic, Anoxic/Hypoxia, Drug Induced/Toxic (specify drug), Hepatic, Hypertensive, Hypoglycemic, Metabolic/Septic, Traumatic/post concussive, Wernicke, Other  Other  Clinically Undetermined   Please exercise your independent, professional judgment when responding. A specific answer is not anticipated or expected.  Thank you, Doy Mince, RN [phone] Clinical Documentation Specialist
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
## 2015-03-16 NOTE — Progress Notes (Signed)
Pt having frequent bilateral lower ext muscle spasms.  Pt said she takes 20mg  bid of baclofen at home.  Currently on 10mg  bid.  Called Elink and requested increase in dosage.  Erick Blinks, RN

## 2015-03-17 ENCOUNTER — Inpatient Hospital Stay (HOSPITAL_COMMUNITY): Payer: Medicare Other

## 2015-03-17 LAB — GLUCOSE, CAPILLARY
GLUCOSE-CAPILLARY: 101 mg/dL — AB (ref 65–99)
GLUCOSE-CAPILLARY: 86 mg/dL (ref 65–99)
GLUCOSE-CAPILLARY: 89 mg/dL (ref 65–99)
Glucose-Capillary: 110 mg/dL — ABNORMAL HIGH (ref 65–99)
Glucose-Capillary: 129 mg/dL — ABNORMAL HIGH (ref 65–99)
Glucose-Capillary: 136 mg/dL — ABNORMAL HIGH (ref 65–99)

## 2015-03-17 LAB — BASIC METABOLIC PANEL
ANION GAP: 15 (ref 5–15)
Anion gap: 14 (ref 5–15)
BUN: 12 mg/dL (ref 6–20)
BUN: 9 mg/dL (ref 6–20)
CALCIUM: 8.4 mg/dL — AB (ref 8.9–10.3)
CHLORIDE: 102 mmol/L (ref 101–111)
CHLORIDE: 95 mmol/L — AB (ref 101–111)
CO2: 25 mmol/L (ref 22–32)
CO2: 31 mmol/L (ref 22–32)
CREATININE: 0.57 mg/dL (ref 0.44–1.00)
Calcium: 8.7 mg/dL — ABNORMAL LOW (ref 8.9–10.3)
Creatinine, Ser: 0.65 mg/dL (ref 0.44–1.00)
GFR calc Af Amer: >60 mL/min (ref >60)
GFR calc Af Amer: >60 mL/min (ref >60)
GFR calc non Af Amer: >60 mL/min (ref >60)
GLUCOSE: 112 mg/dL — AB (ref 65–99)
Glucose, Bld: 97 mg/dL (ref 65–99)
POTASSIUM: 2.5 mmol/L — AB (ref 3.5–5.1)
Potassium: 3.7 mmol/L (ref 3.5–5.1)
Sodium: 142 mmol/L (ref 135–145)
Sodium: 140 mmol/L (ref 135–145)

## 2015-03-17 LAB — MAGNESIUM: MAGNESIUM: 1.7 mg/dL (ref 1.7–2.4)

## 2015-03-17 LAB — PHOSPHORUS: Phosphorus: 2.1 mg/dL — ABNORMAL LOW (ref 2.5–4.6)

## 2015-03-17 MED ORDER — POTASSIUM CHLORIDE 10 MEQ/100ML IV SOLN
INTRAVENOUS | Status: AC
  Filled 2015-03-17: qty 100

## 2015-03-17 MED ORDER — BACLOFEN 10 MG PO TABS
20.0000 mg | ORAL_TABLET | Freq: Two times a day (BID) | ORAL | Status: DC
  Administered 2015-03-17 – 2015-03-25 (×16): 20 mg via ORAL
  Filled 2015-03-17 (×16): qty 2

## 2015-03-17 MED ORDER — POTASSIUM PHOSPHATES 15 MMOLE/5ML IV SOLN
20.0000 mmol | Freq: Once | INTRAVENOUS | Status: AC
  Administered 2015-03-17: 20 mmol via INTRAVENOUS
  Filled 2015-03-17: qty 6.67

## 2015-03-17 MED ORDER — IPRATROPIUM-ALBUTEROL 0.5-2.5 (3) MG/3ML IN SOLN
3.0000 mL | Freq: Three times a day (TID) | RESPIRATORY_TRACT | Status: DC
  Administered 2015-03-17 – 2015-03-23 (×15): 3 mL via RESPIRATORY_TRACT
  Filled 2015-03-17 (×17): qty 3

## 2015-03-17 MED ORDER — LIP MEDEX EX OINT
TOPICAL_OINTMENT | CUTANEOUS | Status: AC
  Administered 2015-03-17: 20:00:00
  Filled 2015-03-17: qty 7

## 2015-03-17 MED ORDER — POTASSIUM CHLORIDE 10 MEQ/100ML IV SOLN
10.0000 meq | INTRAVENOUS | Status: AC
  Administered 2015-03-17 (×10): 10 meq via INTRAVENOUS
  Filled 2015-03-17 (×7): qty 100

## 2015-03-17 MED ORDER — LISINOPRIL 20 MG PO TABS
20.0000 mg | ORAL_TABLET | Freq: Every day | ORAL | Status: DC
  Administered 2015-03-18 – 2015-03-25 (×8): 20 mg via ORAL
  Filled 2015-03-17 (×3): qty 1
  Filled 2015-03-17 (×2): qty 2
  Filled 2015-03-17 (×3): qty 1

## 2015-03-17 MED ORDER — ALBUTEROL SULFATE (2.5 MG/3ML) 0.083% IN NEBU
2.5000 mg | INHALATION_SOLUTION | RESPIRATORY_TRACT | Status: DC | PRN
  Administered 2015-03-19: 2.5 mg via RESPIRATORY_TRACT
  Filled 2015-03-17: qty 3

## 2015-03-17 MED ORDER — FUROSEMIDE 10 MG/ML IJ SOLN
40.0000 mg | Freq: Once | INTRAMUSCULAR | Status: AC
  Administered 2015-03-17: 40 mg via INTRAVENOUS
  Filled 2015-03-17: qty 4

## 2015-03-17 NOTE — Progress Notes (Signed)
CRITICAL VALUE ALERT  Critical value received:  K+ 2.5  Date of notification:  03/17/15  Time of notification:  [id number]  Critical value read back:yes  Nurse who received alert:  Joslyn Devon RN  MD notified (1st page):  Mangul  Time of first page:  0520  MD notified (2nd page):N/A  Time of second page:N/A  Responding MD:  Mangul  Time MD responded:  [phone]

## 2015-03-17 NOTE — Progress Notes (Signed)
Patient was having difficulty with pain and spasms today.  She had a CT of the chest scheduled and asked that we do it tomorrow.  Spoke with Dr. Arsenio Loader and although they would ideally like to have it done today, he was okay to wait until tomorrow. She is being placed back on her home dose of Baclofen and she feels she will be better tomorrow to do the CT.

## 2015-03-17 NOTE — Progress Notes (Signed)
SLP Cancellation Note  Patient Details Name: Deanna Morgan MRN: [id number] DOB: [birth year]   Cancelled treatment:       Reason Eval/Treat Not Completed: Fatigue/lethargy limiting ability to participate (RN reports pt did not sleep last pm and has been sleeping all morning, will reattempt at another time)   Donavan Burnet, MS [facility] SLP [phone]
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
## 2015-03-17 NOTE — Progress Notes (Signed)
Patient asking for Baclofen to be adjusted to home dose due to increased spasticity.  Dose adjusted to 20 mg BID.  Monitor respiratory status closely.    Canary Brim, NP-C [facility] Pgr: [phone] or if no answer [phone] 03/17/2015, 1:38 PM

## 2015-03-17 NOTE — Progress Notes (Signed)
Patient Name: Deanna Morgan Date of Encounter: 03/17/2015  Primary Cardiologist: Dr. Patty Sermons   Active Problems:   Aspiration pneumonia   Abdominal pain, acute   Arterial hypotension   Hypoxia   Ileus   Acute combined systolic and diastolic heart failure    SUBJECTIVE  On 02 mask. She is still dyspneic but much better than yesterday. Appears less drowsy today than notes document yesterday. Breathing not back to baseline. No CP or PND.    CURRENT MEDS . antiseptic oral rinse  7 mL Mouth Rinse q12n4p  . baclofen  10 mg Oral BID  . chlorhexidine  15 mL Mouth Rinse BID  . docusate sodium  100 mg Oral Daily  . furosemide  40 mg Oral BID  . heparin  5,000 Units Subcutaneous 3 times per day  . insulin aspart  0-9 Units Subcutaneous 6 times per day  . lactulose  20 g Oral BID  . lisinopril  10 mg Oral Daily  . pantoprazole (PROTONIX) IV  40 mg Intravenous QHS  . piperacillin-tazobactam (ZOSYN)  IV  3.375 g Intravenous 3 times per day  . potassium chloride  10 mEq Intravenous Q1H  . potassium chloride      . potassium phosphate IVPB (mmol)  20 mmol Intravenous Once  . vancomycin  500 mg Intravenous Q12H    OBJECTIVE  Filed Vitals:   03/17/15 0400 03/17/15 0500 03/17/15 0600 03/17/15 0700  BP: 141/91 138/97 130/81 133/96  Pulse: 92 93 81 88  Temp: 97.7 F (36.5 C) 97.5 F (36.4 C) 97.7 F (36.5 C) 97.5 F (36.4 C)  TempSrc:      Resp: 31 28 23 27   Height:      Weight:      SpO2: 99% 98% 98% 97%    Intake/Output Summary (Last 24 hours) at 03/17/15 0747 Last data filed at 03/17/15 0659  Gross per 24 hour  Intake   1560 ml  Output   2771 ml  Net  -1211 ml   Filed Weights   03/14/15 0746 03/14/15 1300 03/15/15 0413  Weight: 138 lb (62.596 kg) 151 lb 3.8 oz (68.6 kg) 154 lb 1.6 oz (69.9 kg)    PHYSICAL EXAM  General: on oxygen mask,appears more alert today. Quadriplegic  Neuro: Alert. Psych: normal HEENT:  Normal  Neck: Supple without bruits. +  JVD Lungs: Anterior/posterior exam with crackles.  Heart: RRR. no s3, s4, or murmurs. Abdomen: Soft, non-tender, non-distended, BS + x 4. Slightly distended  Extremities: DP/PT/Radials 2+ and equal bilaterally. Extremities cool to touch  Accessory Clinical Findings  CBC  Recent Labs  03/16/15 0335  WBC 10.2  HGB 12.1  HCT 38.2  MCV 90.1  PLT 166   Basic Metabolic Panel  Recent Labs  03/16/15 0335 03/17/15 0340  NA 136 142  K 4.1 2.5*  CL 106 102  CO2 21* 25  GLUCOSE 129* 112*  BUN 15 12  CREATININE 0.54 0.65  CALCIUM 8.8* 8.7*  MG 2.2 1.7  PHOS 2.3* 2.1*   Cardiac Enzymes  Recent Labs  03/14/15 1400 03/14/15 2110  TROPONINI 5.52* 5.24*    TELE NSR with HR 90-100s    ECG  No new EKG  Echocardiogram 03/15/2015  LV EF: 30% -  35% Study Conclusions - Left ventricle: The cavity size was normal. Wall thickness was increased in a pattern of mild LVH. Systolic function was moderately to severely reduced. The estimated ejection fraction was in the range of 30% to 35%. There  is akinesis of the mid-apicalanteroseptal, lateral, and inferior myocardium. Consider Taketsubo cardiomyopathy (stress induced cardiomyopathy) if ischemia is excluded. Doppler parameters are consistent with abnormal left ventricular relaxation (grade 1 diastolic dysfunction). - Pulmonary arteries: Systolic pressure was mildly increased. PA peak pressure: 43 mm Hg (S).    Radiology/Studies  Dg Chest Port 1 View  03/16/2015   CLINICAL DATA:  Acute respiratory failure with hypoxemia  EXAM: PORTABLE CHEST - 1 VIEW  COMPARISON:  03/14/2015  FINDINGS: Progression of bilateral airspace disease which is symmetric. Progression of bilateral effusion and bibasilar atelectasis. Findings are most consistent with congestive heart failure.  Bilateral shoulder subluxation.  Right shoulder prosthesis.  IMPRESSION: Progression of bilateral airspace disease and bilateral effusions.  Findings most consistent with congestive heart failure with edema. Progression of bibasilar atelectasis.   Electronically Signed   By: Marlan Palau M.D.   On: 03/16/2015 07:08   Dg Chest Port 1 View  03/14/2015   CLINICAL DATA:  Respiratory distress, hypoxia and hypotensive.  EXAM: PORTABLE CHEST - 1 VIEW  COMPARISON:  12/29/2014  FINDINGS: The cardiomediastinal silhouette is unremarkable.  Interstitial prominence now noted.  Focal opacity overlying the right lower lung is again noted.  There is no evidence of pneumothorax or definite pleural effusion.  Chronic dislocations of scratch de chronic bilateral shoulder dislocations noted with proximal right humeral prosthesis again identified.  No acute bony abnormalities are present.  IMPRESSION: Interstitial prominence which may represent interstitial edema or infection.  Persistent opacity overlying the right lower lung. If remote outside studies prior to 12/29/2014 cannot be obtained, recommend chest CT for further evaluation.  Chronic bilateral shoulder dislocations.   Electronically Signed   By: Harmon Pier M.D.   On: 03/14/2015 07:58   Dg Abd Portable 1v  03/14/2015   CLINICAL DATA:  Diffuse abdominal pain.  Chronic constipation.  EXAM: PORTABLE ABDOMEN - 1 VIEW  COMPARISON:  None.  FINDINGS: Normal bowel gas pattern. Prominent stool throughout the colon. Probable rectal temperature probe. Lumbar spine rotary scoliosis. Diffuse osteopenia.  IMPRESSION: Prominent stool throughout the colon.   Electronically Signed   By: Beckie Salts M.D.   On: 03/14/2015 09:40    ASSESSMENT AND PLAN  64 year old woman admitted as an emergency from [facility] with altered level of responsiveness. Found to have elevated troponin.  1. Elevated trop, unclear if stress induced cardiomyopathy vs ACS  - echo pending to look at LV function and wall motion  - trop higher than expected given normal renal function, trop 8.04 --> 5.52 --> 5.24. Initial lactic acid 1.56,  negative procalcitonin noted. Positive UTI, RLL infiltrated noted. Her pain is very atypical and everywhere per patient.  2. 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 No good candidate for invasive workup now, repeat echo in 1 week to see if has any improvement.  - with new LV dysfunction, high risk of acute HF  - Started on ACEinh and place on scheduled diuretics until anticipated improvement in LVEF towards the end of the week  3. Acute systolic HF  -yesterday she was noted to have s/s CHF on exam and CXR. She was given IV lasix 40mg  x1 and then placed on scheduled Lasix 40mg  BID. She is net neg 1.2 L since yesterday. No new weights. Her creat is normal. She is still dyspneic and wet on exam. I am going to give her one more dose of  IV lasix . Good UOP per patient.   4. Possible RLL PNA  - NPO to avoid aspiration, pending eval by speech  5. Hypertension- continue ACEinh  6. 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  7. HLD on statin therapy  8. Hypokalemia- K 2.5. This is being supplemented with IV K   Dispo- Despite anticipated recovery, prognosis is very guarded due to underlying neurological problems and secondary ventilatory abnormalities as well as superimposed pneumonia.     Signed, THOMPSON, KATHRYN R PA-C   I have seen and examined the patient along with Cline Crock R PA-C.  I have reviewed the chart, notes and new data.  I agree with PA's note.  PLAN: Acute systolic HF due to presumed Takotsubo sd. Still roughly 5L "ahead" since admission. Continue diuretics. Increase ACEi. Aggressively replace K. Recheck echo on/around Monday 912/16.  Thurmon Fair, MD, [facility] [phone] 03/17/2015, 1:09 PM

## 2015-03-17 NOTE — Progress Notes (Signed)
eLink Physician-Brief Progress Note Patient Name: Ciearra Leifer DOB: 1951/05/05 MRN: [id number]   Date of Service  03/17/2015  HPI/Events of Note  Hypokalemia, and hypophosphotemia  eICU Interventions  Potassium and phosphorus replaced.      Intervention Category Intermediate Interventions: Electrolyte abnormality - evaluation and management  Vitaliy Eisenhour 03/17/2015, 6:57 AM

## 2015-03-17 NOTE — Progress Notes (Signed)
eLink Nursing ICU Electrolyte Replacement Protocol  Patient Name: Romanita Maccracken DOB: 09-Feb-1951 MRN: [id number]  Date of Service  03/17/2015   HPI/Events of Note    Recent Labs Lab 03/14/15 0728  03/14/15 0741 03/15/15 1135 03/16/15 0335 03/17/15 0340  NA  --   --  133* 136 136 142  K  --   < > 3.5 3.8 4.1 2.5*  CL  --   --  98* 105 106 102  CO2  --   --   --  20* 21* 25  GLUCOSE  --   --  124* 166* 129* 112*  BUN  --   --  19 15 15 12   CREATININE 0.76  --  0.70 0.58 0.54 0.65  CALCIUM  --   --   --  8.5* 8.8* 8.7*  MG  --   --   --   --  2.2  --   PHOS  --   --   --   --  2.3*  --   < > = values in this interval not displayed.  Estimated Creatinine Clearance: 65.7 mL/min (by C-G formula based on Cr of 0.65).  Intake/Output      09/07 0701 - 09/08 0700   I.V. (mL/kg) 100 (1.4)   IV Piggyback 150   Total Intake(mL/kg) 250 (3.6)   Urine (mL/kg/hr) 2770 (1.7)   Stool 1 (0)   Total Output 2771   Net -2521        - I/O DETAILED x24h    Total I/O In: 20 [I.V.:20] Out: -  - I/O THIS SHIFT    ASSESSMENT   eICURN Interventions  K+ 2.5 MD notified and replaced per protocol.    ASSESSMENT: MAJOR ELECTROLYTE    Merita Norton 03/17/2015, 6:03 AM

## 2015-03-18 ENCOUNTER — Inpatient Hospital Stay (HOSPITAL_COMMUNITY): Payer: Medicare Other

## 2015-03-18 DIAGNOSIS — J9601 Acute respiratory failure with hypoxia: Secondary | ICD-10-CM | POA: Insufficient documentation

## 2015-03-18 LAB — BASIC METABOLIC PANEL
Anion gap: 14 (ref 5–15)
BUN: 9 mg/dL (ref 6–20)
CHLORIDE: 96 mmol/L — AB (ref 101–111)
CO2: 31 mmol/L (ref 22–32)
CREATININE: 0.6 mg/dL (ref 0.44–1.00)
Calcium: 8.4 mg/dL — ABNORMAL LOW (ref 8.9–10.3)
GFR calc Af Amer: >60 mL/min (ref >60)
Glucose, Bld: 109 mg/dL — ABNORMAL HIGH (ref 65–99)
Potassium: 3.3 mmol/L — ABNORMAL LOW (ref 3.5–5.1)
SODIUM: 141 mmol/L (ref 135–145)

## 2015-03-18 LAB — GLUCOSE, CAPILLARY
GLUCOSE-CAPILLARY: 121 mg/dL — AB (ref 65–99)
GLUCOSE-CAPILLARY: 126 mg/dL — AB (ref 65–99)
GLUCOSE-CAPILLARY: 126 mg/dL — AB (ref 65–99)
GLUCOSE-CAPILLARY: 99 mg/dL (ref 65–99)
Glucose-Capillary: 115 mg/dL — ABNORMAL HIGH (ref 65–99)
Glucose-Capillary: 98 mg/dL (ref 65–99)

## 2015-03-18 MED ORDER — POTASSIUM CHLORIDE 10 MEQ/100ML IV SOLN
10.0000 meq | INTRAVENOUS | Status: AC
  Administered 2015-03-18: 10 meq via INTRAVENOUS
  Filled 2015-03-18: qty 100

## 2015-03-18 MED ORDER — PIPERACILLIN-TAZOBACTAM 3.375 G IVPB
3.3750 g | Freq: Three times a day (TID) | INTRAVENOUS | Status: AC
  Administered 2015-03-18 – 2015-03-20 (×8): 3.375 g via INTRAVENOUS
  Filled 2015-03-18 (×9): qty 50

## 2015-03-18 MED ORDER — MORPHINE SULFATE ER 30 MG PO TBCR
30.0000 mg | EXTENDED_RELEASE_TABLET | Freq: Two times a day (BID) | ORAL | Status: DC
  Administered 2015-03-18 – 2015-03-21 (×7): 30 mg via ORAL
  Filled 2015-03-18 (×8): qty 1

## 2015-03-18 MED ORDER — POTASSIUM CHLORIDE CRYS ER 20 MEQ PO TBCR
40.0000 meq | EXTENDED_RELEASE_TABLET | Freq: Two times a day (BID) | ORAL | Status: AC
  Administered 2015-03-18 – 2015-03-19 (×4): 40 meq via ORAL
  Filled 2015-03-18 (×4): qty 2

## 2015-03-18 MED ORDER — MORPHINE SULFATE ER 30 MG PO TBCR: 30.0000 mg | EXTENDED_RELEASE_TABLET | Freq: Two times a day (BID) | ORAL | Status: DC

## 2015-03-18 MED ORDER — MAGNESIUM SULFATE 2 GM/50ML IV SOLN
2.0000 g | Freq: Once | INTRAVENOUS | Status: AC
  Administered 2015-03-18: 2 g via INTRAVENOUS
  Filled 2015-03-18: qty 50

## 2015-03-18 NOTE — Progress Notes (Signed)
Patient Name: Deanna Morgan Date of Encounter: 03/18/2015  Primary Cardiologist: Dr. Patty Sermons   Active Problems:   Aspiration pneumonia   Abdominal pain, acute   Arterial hypotension   Hypoxia   Ileus   Acute combined systolic and diastolic heart failure    SUBJECTIVE  Much more awake than when I saw her 2 days ago. Breathing better now. On [place]. Complain of significant abdominal pain for last 2 hrs. No bowel movement since night of 9/7. Also says she want to have a port placed as peripheral IV keep infiltrating, her last port was removed a month ago due to infection, she understand that she may not get a port until her infections clears. She's currently on zosyn. Asking about PICC line. Complained that she is on too many narcotics at home, wants to cut back but don't know how. She says constipation is a recurring problem       CURRENT MEDS . antiseptic oral rinse  7 mL Mouth Rinse q12n4p  . baclofen  20 mg Oral BID  . chlorhexidine  15 mL Mouth Rinse BID  . docusate sodium  100 mg Oral Daily  . furosemide  40 mg Oral BID  . heparin  5,000 Units Subcutaneous 3 times per day  . insulin aspart  0-9 Units Subcutaneous 6 times per day  . ipratropium-albuterol  3 mL Nebulization TID  . lactulose  20 g Oral BID  . lisinopril  20 mg Oral Daily  . magnesium sulfate 1 - 4 g bolus IVPB  2 g Intravenous Once  . pantoprazole (PROTONIX) IV  40 mg Intravenous QHS  . piperacillin-tazobactam (ZOSYN)  IV  3.375 g Intravenous 3 times per day    OBJECTIVE  Filed Vitals:   03/18/15 0010 03/18/15 0100 03/18/15 0800 03/18/15 0902  BP: 133/85     Pulse:  101    Temp:  99.3 F (37.4 C) 97.7 F (36.5 C)   TempSrc:   Oral   Resp:  33    Height:      Weight:      SpO2:  99%  98%    Intake/Output Summary (Last 24 hours) at 03/18/15 1043 Last data filed at 03/18/15 0600  Gross per 24 hour  Intake   2650 ml  Output   4000 ml  Net  -1350 ml   Filed Weights   03/14/15 0746 03/14/15  1300 03/15/15 0413  Weight: 138 lb (62.596 kg) 151 lb 3.8 oz (68.6 kg) 154 lb 1.6 oz (69.9 kg)    PHYSICAL EXAM  General: Off O2 mask, on  Neuro: Alert. Psych: normal affect HEENT:  Normal  Neck: Supple without bruits. Lungs: Appears to be very weak and using accessory muscles to breath. Anterior exam crackles noted esp on R side Heart: RRR. no s3, s4, or murmurs. Abdomen: +significantly distended abdomen, diffusely tender no significant bowel sound. Sitting on bed pan. Extremities: Chronic muscle contracture in hands. SCD in place in LE.   Accessory Clinical Findings  CBC  Recent Labs  03/16/15 0335  WBC 10.2  HGB 12.1  HCT 38.2  MCV 90.1  PLT 166   Basic Metabolic Panel  Recent Labs  03/16/15 0335 03/17/15 0340 03/17/15 2000 03/18/15 0400  NA 136 142 140 141  K 4.1 2.5* 3.7 3.3*  CL 106 102 95* 96*  CO2 21* 25 31 31   GLUCOSE 129* 112* 97 109*  BUN 15 12 9 9   CREATININE 0.54 0.65 0.57 0.60  CALCIUM 8.8*  8.7* 8.4* 8.4*  MG 2.2 1.7  --   --   PHOS 2.3* 2.1*  --   --     TELE NSR with HR 80-90s, occasional HR 100s    ECG  No new EKG  Echocardiogram 03/15/2015  LV EF: 30% -  35%  ------------------------------------------------------------------- Indications:   Dyspnea 786.09.  ------------------------------------------------------------------- History:  PMH: No prior cardiac history.  ------------------------------------------------------------------- Study Conclusions  - Left ventricle: The cavity size was normal. Wall thickness was increased in a pattern of mild LVH. Systolic function was moderately to severely reduced. The estimated ejection fraction was in the range of 30% to 35%. There is akinesis of the mid-apicalanteroseptal, lateral, and inferior myocardium. Consider Taketsubo cardiomyopathy (stress induced cardiomyopathy) if ischemia is excluded. Doppler parameters are consistent with abnormal left ventricular  relaxation (grade 1 diastolic dysfunction). - Pulmonary arteries: Systolic pressure was mildly increased. PA peak pressure: 43 mm Hg (S).    Radiology/Studies  Dg Chest Port 1 View  03/16/2015   CLINICAL DATA:  Acute respiratory failure with hypoxemia  EXAM: PORTABLE CHEST - 1 VIEW  COMPARISON:  03/14/2015  FINDINGS: Progression of bilateral airspace disease which is symmetric. Progression of bilateral effusion and bibasilar atelectasis. Findings are most consistent with congestive heart failure.  Bilateral shoulder subluxation.  Right shoulder prosthesis.  IMPRESSION: Progression of bilateral airspace disease and bilateral effusions. Findings most consistent with congestive heart failure with edema. Progression of bibasilar atelectasis.   Electronically Signed   By: Marlan Palau M.D.   On: 03/16/2015 07:08   Dg Chest Port 1 View  03/14/2015   CLINICAL DATA:  Respiratory distress, hypoxia and hypotensive.  EXAM: PORTABLE CHEST - 1 VIEW  COMPARISON:  12/29/2014  FINDINGS: The cardiomediastinal silhouette is unremarkable.  Interstitial prominence now noted.  Focal opacity overlying the right lower lung is again noted.  There is no evidence of pneumothorax or definite pleural effusion.  Chronic dislocations of scratch de chronic bilateral shoulder dislocations noted with proximal right humeral prosthesis again identified.  No acute bony abnormalities are present.  IMPRESSION: Interstitial prominence which may represent interstitial edema or infection.  Persistent opacity overlying the right lower lung. If remote outside studies prior to 12/29/2014 cannot be obtained, recommend chest CT for further evaluation.  Chronic bilateral shoulder dislocations.   Electronically Signed   By: Harmon Pier M.D.   On: 03/14/2015 07:58   Dg Abd Portable 1v  03/14/2015   CLINICAL DATA:  Diffuse abdominal pain.  Chronic constipation.  EXAM: PORTABLE ABDOMEN - 1 VIEW  COMPARISON:  None.  FINDINGS: Normal bowel gas  pattern. Prominent stool throughout the colon. Probable rectal temperature probe. Lumbar spine rotary scoliosis. Diffuse osteopenia.  IMPRESSION: Prominent stool throughout the colon.   Electronically Signed   By: Beckie Salts M.D.   On: 03/14/2015 09:40    ASSESSMENT AND PLAN  64 year old woman admitted as an emergency from [facility] with altered level of responsiveness. Found to have elevated troponin.  1. Elevated trop, unclear if stress induced cardiomyopathy vs ACS  - echo pending to look at LV function and wall motion  - trop higher than expected given normal renal function, trop 8.04 --> 5.52 --> 5.24. Initial lactic acid 1.56, negative procalcitonin noted. Positive UTI, RLL infiltrated noted on initial CXR  - plan to repeat echo in 1 week.   2. 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 No  good candidate for invasive workup now, repeat echo in 1 week to see if has any improvement.  - lisinopril added, dose increased to  daily. Will add coreg later once HF symptom resolve.  3. Acute systolic HF  - improving with IV diuresis, currently on  BID PO lasix. Still has rale on R side from anterior aucultation, will continue diuresis.   4. Possible RLL PNA  - NPO to avoid aspiration, pending eval by speech. She wants to eat. Her mental status is improving.   5. Hypotension 2/2 #2, resolved  6. 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  7. History of hypercholesterolemia, on statin therapy  8. Abdominal distention: no BM yesterday, currently sitting on bed pan, expect improvement with movement. Constipation likely related to frequent narcotics. Will defer to critical care group to eval of obstruction if no improvement.    Ramond Dial PA-C Pager: [phone]  I have seen and examined the patient along with Azalee Course PA-C.  I have  reviewed the chart, notes and new data.  I agree with PA's note.  Key new complaints: much improved, smiling Key examination changes: less tachycardia, lungs clearing   PLAN: Continue ACEi and oral diuretics. Recheck echo on Monday. If LVEF returns fully, I'm not sure beta blockers will be of help. If this was stress cardiomyopathy, would expect no long term sequelae.  Thurmon Fair, MD, [facility] [phone] 03/18/2015, 2:41 PM
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
## 2015-03-18 NOTE — Progress Notes (Signed)
eLink Physician-Brief Progress Note Patient Name: Deanna Morgan DOB: Mar 22, 1951 MRN: [id number]   Date of Service  03/18/2015  HPI/Events of Note  Constipation d/t morphine. Patient requests home MS Contin.  eICU Interventions  Will order: 1. D/C Morphine IV PRN. 2. MS Contin 30 mg PO Q 12 hours (half of home dose to start).     Intervention Category Intermediate Interventions: Pain - evaluation and management  Sommer,Steven Eugene 03/18/2015, 4:46 PM
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
## 2015-03-18 NOTE — Progress Notes (Signed)
Date:  Sept.9, 2016 U.R. performed for needs and level of care. Will continue to follow for Case Management needs.  Keara Pagliarulo, RN, BSN, CCM   [phone] 
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
## 2015-03-19 ENCOUNTER — Inpatient Hospital Stay (HOSPITAL_COMMUNITY): Payer: Medicare Other

## 2015-03-19 DIAGNOSIS — J189 Pneumonia, unspecified organism: Secondary | ICD-10-CM | POA: Insufficient documentation

## 2015-03-19 DIAGNOSIS — R0902 Hypoxemia: Secondary | ICD-10-CM

## 2015-03-19 DIAGNOSIS — L8990 Pressure ulcer of unspecified site, unspecified stage: Secondary | ICD-10-CM | POA: Insufficient documentation

## 2015-03-19 DIAGNOSIS — J690 Pneumonitis due to inhalation of food and vomit: Secondary | ICD-10-CM

## 2015-03-19 DIAGNOSIS — I5032 Chronic diastolic (congestive) heart failure: Secondary | ICD-10-CM | POA: Insufficient documentation

## 2015-03-19 DIAGNOSIS — K5900 Constipation, unspecified: Secondary | ICD-10-CM | POA: Insufficient documentation

## 2015-03-19 DIAGNOSIS — J9601 Acute respiratory failure with hypoxia: Secondary | ICD-10-CM

## 2015-03-19 LAB — BASIC METABOLIC PANEL
ANION GAP: 11 (ref 5–15)
BUN: 8 mg/dL (ref 6–20)
CHLORIDE: 97 mmol/L — AB (ref 101–111)
CO2: 32 mmol/L (ref 22–32)
Calcium: 9 mg/dL (ref 8.9–10.3)
Creatinine, Ser: 0.65 mg/dL (ref 0.44–1.00)
GFR calc Af Amer: >60 mL/min (ref >60)
GLUCOSE: 94 mg/dL (ref 65–99)
POTASSIUM: 3.7 mmol/L (ref 3.5–5.1)
Sodium: 140 mmol/L (ref 135–145)

## 2015-03-19 LAB — CULTURE, BLOOD (ROUTINE X 2)
CULTURE: NO GROWTH
CULTURE: NO GROWTH

## 2015-03-19 LAB — MAGNESIUM: Magnesium: 2.3 mg/dL (ref 1.7–2.4)

## 2015-03-19 LAB — GLUCOSE, CAPILLARY
GLUCOSE-CAPILLARY: 98 mg/dL (ref 65–99)
Glucose-Capillary: 106 mg/dL — ABNORMAL HIGH (ref 65–99)
Glucose-Capillary: 91 mg/dL (ref 65–99)

## 2015-03-19 LAB — CBC
HEMATOCRIT: 39.4 % (ref 36.0–46.0)
HEMOGLOBIN: 12.7 g/dL (ref 12.0–15.0)
MCH: 28.5 pg (ref 26.0–34.0)
MCHC: 32.2 g/dL (ref 30.0–36.0)
MCV: 88.5 fL (ref 78.0–100.0)
Platelets: 190 10*3/uL (ref 150–400)
RBC: 4.45 MIL/uL (ref 3.87–5.11)
RDW: 14 % (ref 11.5–15.5)
WBC: 7.1 10*3/uL (ref 4.0–10.5)

## 2015-03-19 MED ORDER — IBUPROFEN 200 MG PO TABS
400.0000 mg | ORAL_TABLET | Freq: Three times a day (TID) | ORAL | Status: DC | PRN
  Administered 2015-03-23 – 2015-03-24 (×2): 400 mg via ORAL
  Filled 2015-03-19 (×3): qty 2

## 2015-03-19 MED ORDER — SERTRALINE HCL 50 MG PO TABS
50.0000 mg | ORAL_TABLET | Freq: Every day | ORAL | Status: DC
  Administered 2015-03-19 – 2015-03-25 (×7): 50 mg via ORAL
  Filled 2015-03-19 (×7): qty 1

## 2015-03-19 MED ORDER — POLYETHYLENE GLYCOL 3350 17 G PO PACK
17.0000 g | PACK | Freq: Two times a day (BID) | ORAL | Status: DC
  Administered 2015-03-19 – 2015-03-24 (×8): 17 g via ORAL
  Filled 2015-03-19 (×9): qty 1

## 2015-03-19 MED ORDER — SIMVASTATIN 40 MG PO TABS
40.0000 mg | ORAL_TABLET | Freq: Every day | ORAL | Status: DC
  Administered 2015-03-19 – 2015-03-24 (×6): 40 mg via ORAL
  Filled 2015-03-19 (×6): qty 1

## 2015-03-19 MED ORDER — ASPIRIN EC 81 MG PO TBEC
81.0000 mg | DELAYED_RELEASE_TABLET | Freq: Every day | ORAL | Status: DC
  Administered 2015-03-19 – 2015-03-25 (×7): 81 mg via ORAL
  Filled 2015-03-19 (×7): qty 1

## 2015-03-19 MED ORDER — PANTOPRAZOLE SODIUM 40 MG PO TBEC
40.0000 mg | DELAYED_RELEASE_TABLET | Freq: Every day | ORAL | Status: DC
  Administered 2015-03-19 – 2015-03-25 (×7): 40 mg via ORAL
  Filled 2015-03-19 (×7): qty 1

## 2015-03-19 MED ORDER — OXYBUTYNIN CHLORIDE ER 15 MG PO TB24
15.0000 mg | ORAL_TABLET | Freq: Every day | ORAL | Status: DC
  Administered 2015-03-19 – 2015-03-25 (×6): 15 mg via ORAL
  Filled 2015-03-19 (×8): qty 1

## 2015-03-19 MED ORDER — RISAQUAD PO CAPS
1.0000 | ORAL_CAPSULE | Freq: Two times a day (BID) | ORAL | Status: DC
  Administered 2015-03-19 – 2015-03-25 (×13): 1 via ORAL
  Filled 2015-03-19 (×18): qty 1

## 2015-03-19 MED ORDER — SENNA 8.6 MG PO TABS
2.0000 | ORAL_TABLET | Freq: Two times a day (BID) | ORAL | Status: DC
  Administered 2015-03-19 – 2015-03-24 (×10): 17.2 mg via ORAL
  Filled 2015-03-19 (×12): qty 2

## 2015-03-19 MED ORDER — MILK AND MOLASSES ENEMA
1.0000 | Freq: Once | RECTAL | Status: AC
  Administered 2015-03-19: 250 mL via RECTAL
  Filled 2015-03-19: qty 250
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
## 2015-03-19 NOTE — Progress Notes (Signed)
Report called and given to Elpidio Galea on 4 [address]. Pt transferred to rm [id number]. Left unit on bed pushed by this rn and nurse tech accompanied by dtr. Left and arrived in good condition. Vwilliams,rn.

## 2015-03-19 NOTE — Progress Notes (Signed)
Pt states her stomach is hurting and does not want cpt at this time.
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
## 2015-03-20 ENCOUNTER — Inpatient Hospital Stay (HOSPITAL_COMMUNITY): Payer: Medicare Other

## 2015-03-20 DIAGNOSIS — K5901 Slow transit constipation: Secondary | ICD-10-CM

## 2015-03-20 DIAGNOSIS — I5021 Acute systolic (congestive) heart failure: Secondary | ICD-10-CM

## 2015-03-20 DIAGNOSIS — K560 Paralytic ileus: Secondary | ICD-10-CM | POA: Insufficient documentation

## 2015-03-20 DIAGNOSIS — G8250 Quadriplegia, unspecified: Secondary | ICD-10-CM | POA: Insufficient documentation

## 2015-03-20 DIAGNOSIS — K5900 Constipation, unspecified: Secondary | ICD-10-CM | POA: Insufficient documentation

## 2015-03-20 LAB — MAGNESIUM: MAGNESIUM: 2.3 mg/dL (ref 1.7–2.4)

## 2015-03-20 MED ORDER — CARVEDILOL 3.125 MG PO TABS
3.1250 mg | ORAL_TABLET | Freq: Two times a day (BID) | ORAL | Status: DC
  Administered 2015-03-20 (×2): 3.125 mg via ORAL
  Filled 2015-03-20 (×3): qty 1

## 2015-03-20 MED ORDER — POTASSIUM CHLORIDE IN NACL 40-0.9 MEQ/L-% IV SOLN
INTRAVENOUS | Status: AC
  Administered 2015-03-20: 50 mL/h via INTRAVENOUS
  Filled 2015-03-20 (×2): qty 1000

## 2015-03-20 NOTE — Progress Notes (Signed)
Patient Name: Deanna Morgan Date of Encounter: 03/20/2015  Primary Cardiologist: Dr. Patty Sermons   Active Problems:   Aspiration pneumonia   Abdominal pain, acute   Arterial hypotension   Hypoxia   Ileus   Acute combined systolic and diastolic heart failure   Acute respiratory failure with hypoxemia   Pressure ulcer   HCAP (healthcare-associated pneumonia)   Acute systolic CHF (congestive heart failure)   CN (constipation)    SUBJECTIVE  Comfortable. Improving. Retired Engineer, civil (consulting).      CURRENT MEDS . acidophilus  1 capsule Oral BID  . antiseptic oral rinse  7 mL Mouth Rinse q12n4p  . aspirin EC  81 mg Oral Daily  . baclofen  20 mg Oral BID  . chlorhexidine  15 mL Mouth Rinse BID  . furosemide  40 mg Oral BID  . heparin  5,000 Units Subcutaneous 3 times per day  . ipratropium-albuterol  3 mL Nebulization TID  . lisinopril  20 mg Oral Daily  . morphine  30 mg Oral Q12H  . oxybutynin  15 mg Oral Daily  . pantoprazole  40 mg Oral Daily  . piperacillin-tazobactam (ZOSYN)  IV  3.375 g Intravenous 3 times per day  . polyethylene glycol  17 g Oral BID  . senna  2 tablet Oral BID  . sertraline  50 mg Oral Daily  . simvastatin  40 mg Oral QHS    OBJECTIVE  Filed Vitals:   03/19/15 1956 03/19/15 2040 03/20/15 0333 03/20/15 0754  BP:  116/47 124/71   Pulse:  88 85   Temp:  98.2 F (36.8 C) 97.7 F (36.5 C)   TempSrc:  Oral Oral   Resp:  20 16   Height:      Weight:   131 lb 13.4 oz (59.8 kg)   SpO2: 98% 97% 95% 96%    Intake/Output Summary (Last 24 hours) at 03/20/15 0833 Last data filed at 03/19/15 2300  Gross per 24 hour  Intake    340 ml  Output   1425 ml  Net  -1085 ml   Filed Weights   03/18/15 1100 03/19/15 0410 03/20/15 0333  Weight: 143 lb 11.8 oz (65.2 kg) 135 lb 9.3 oz (61.5 kg) 131 lb 13.4 oz (59.8 kg)    PHYSICAL EXAM  General: on [place] Neuro: Alert. Psych: normal affect HEENT:  Normal  Neck: Supple without bruits. Lungs: minimal crackles.  Decreased use of accessory muscles.  Heart: RRR. no s3, s4, or murmurs. Abdomen: mildly distended abdomen, diffusely tender no significant bowel sound.  Extremities: Chronic muscle contracture in hands.     CBC  Recent Labs  03/19/15 0350  WBC 7.1  HGB 12.7  HCT 39.4  MCV 88.5  PLT 190   Basic Metabolic Panel  Recent Labs  03/18/15 0400 03/19/15 0350  NA 141 140  K 3.3* 3.7  CL 96* 97*  CO2 31 32  GLUCOSE 109* 94  BUN 9 8  CREATININE 0.60 0.65  CALCIUM 8.4* 9.0  MG  --  2.3    TELE Prior-NSR with HR 80-90s, occasional HR 100s/ sinus tachy    ECG  No new EKG  Echocardiogram 03/15/2015  LV EF: 30% -  35%  ------------------------------------------------------------------- Indications:   Dyspnea 786.09.  ------------------------------------------------------------------- History:  PMH: No prior cardiac history.  ------------------------------------------------------------------- Study Conclusions  - Left ventricle: The cavity size was normal. Wall thickness was increased in a pattern of mild LVH. Systolic function was moderately to severely reduced. The estimated  ejection fraction was in the range of 30% to 35%. There is akinesis of the mid-apicalanteroseptal, lateral, and inferior myocardium. Consider Taketsubo cardiomyopathy (stress induced cardiomyopathy) if ischemia is excluded. Doppler parameters are consistent with abnormal left ventricular relaxation (grade 1 diastolic dysfunction). - Pulmonary arteries: Systolic pressure was mildly increased. PA peak pressure: 43 mm Hg (S).    Radiology/Studies  Ct Chest Wo Contrast  03/18/2015   CLINICAL DATA:  Assess airspace disease. Hypoxia. Abnormal chest radiograph. Quadriplegia.  EXAM: CT CHEST WITHOUT CONTRAST  TECHNIQUE: Multidetector CT imaging of the chest was performed following the standard protocol without IV contrast.  COMPARISON:  Most recent chest radiograph 03/16/2015.   FINDINGS: BILATERAL airspace opacities with air bronchograms consistent with bronchopneumonia. These changes are greater on the LEFT. Significant portions of both lower lobes are involved with pneumonia. Slight involvement of the RIGHT middle lobe. BILATERAL pleural effusions accompany the airspace disease. No bronchial obstruction. Probable reactive adenopathy. Thoracic atherosclerosis. No osseous findings. Shunt catheter in the spinal canal, presumed treatment for syringomyelia. Cardiomegaly. Unremarkable upper abdomen.  IMPRESSION: BILATERAL LEFT greater than RIGHT lower lobe and RIGHT middle lobe pneumonia. BILATERAL pleural effusions. Post treatment changes for syringomyelia.   Electronically Signed   By: Elsie Stain M.D.   On: 03/18/2015 18:10   Dg Chest Port 1 View  03/19/2015   CLINICAL DATA:  Aspiration pneumonia.  History of smoking.  EXAM: PORTABLE CHEST - 1 VIEW  COMPARISON:  CT chest 03/18/2015.  Chest radiograph 03/16/2015.  FINDINGS: The heart remains enlarged. BILATERAL lower lobe pulmonary opacities are redemonstrated, and may be stable to slightly improved compared with 03/16/2015. BILATERAL pleural effusions are noted. Shunt tubing lies within the spinal canal for syringomyelia treatment. BILATERAL shoulder dislocations with RIGHT shoulder replacement.  IMPRESSION: Stable to slightly improved BILATERAL pneumonia.   Electronically Signed   By: Elsie Stain M.D.   On: 03/19/2015 10:11   Dg Chest Port 1 View  03/16/2015   CLINICAL DATA:  Acute respiratory failure with hypoxemia  EXAM: PORTABLE CHEST - 1 VIEW  COMPARISON:  03/14/2015  FINDINGS: Progression of bilateral airspace disease which is symmetric. Progression of bilateral effusion and bibasilar atelectasis. Findings are most consistent with congestive heart failure.  Bilateral shoulder subluxation.  Right shoulder prosthesis.  IMPRESSION: Progression of bilateral airspace disease and bilateral effusions. Findings most consistent with  congestive heart failure with edema. Progression of bibasilar atelectasis.   Electronically Signed   By: Marlan Palau M.D.   On: 03/16/2015 07:08   Dg Chest Port 1 View  03/14/2015   CLINICAL DATA:  Respiratory distress, hypoxia and hypotensive.  EXAM: PORTABLE CHEST - 1 VIEW  COMPARISON:  12/29/2014  FINDINGS: The cardiomediastinal silhouette is unremarkable.  Interstitial prominence now noted.  Focal opacity overlying the right lower lung is again noted.  There is no evidence of pneumothorax or definite pleural effusion.  Chronic dislocations of scratch de chronic bilateral shoulder dislocations noted with proximal right humeral prosthesis again identified.  No acute bony abnormalities are present.  IMPRESSION: Interstitial prominence which may represent interstitial edema or infection.  Persistent opacity overlying the right lower lung. If remote outside studies prior to 12/29/2014 cannot be obtained, recommend chest CT for further evaluation.  Chronic bilateral shoulder dislocations.   Electronically Signed   By: Harmon Pier M.D.   On: 03/14/2015 07:58   Dg Abd Portable 1v  03/19/2015   CLINICAL DATA:  Abdominal pain and chronic constipation  EXAM: PORTABLE ABDOMEN - 1 VIEW  COMPARISON:  Five days ago  FINDINGS: Massive stool volume.  No gaseous small bowel distention.  Unchanged appearance of spinal catheter.  Pelvic thermistor noted.  Lumbar dextroscoliosis.  IMPRESSION: 1. Massive stool volume without change from 5 days ago. 2. No evidence of small bowel obstruction.   Electronically Signed   By: Marnee Spring M.D.   On: 03/19/2015 09:58   Dg Abd Portable 1v  03/14/2015   CLINICAL DATA:  Diffuse abdominal pain.  Chronic constipation.  EXAM: PORTABLE ABDOMEN - 1 VIEW  COMPARISON:  None.  FINDINGS: Normal bowel gas pattern. Prominent stool throughout the colon. Probable rectal temperature probe. Lumbar spine rotary scoliosis. Diffuse osteopenia.  IMPRESSION: Prominent stool throughout the colon.    Electronically Signed   By: Beckie Salts M.D.   On: 03/14/2015 09:40    ASSESSMENT AND PLAN  64 year old woman admitted as an emergency from [facility] with altered level of responsiveness. Found to have elevated troponin. Paraplegic.  1. Elevated trop, unclear if stress induced cardiomyopathy vs ACS  - repeating echo pending on Monday to look at LV function and wall motion (hopeful improvement if stress induced)  - trop higher than expected given normal renal function, trop 8.04 --> 5.52 --> 5.24. Initial lactic acid 1.56, negative procalcitonin noted. Positive UTI, RLL infiltrated noted on initial CXR     2. 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 No good candidate for invasive workup.  - lisinopril added, dose increased to 20mg  daily.   -Will add coreg 3.125 BID  3. Acute systolic HF  - currently on 40mg  BID PO lasix. Continue  4. Possible RLL PNA   5. Hypotension 2/2 #2, resolved  6. 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  7. History of hypercholesterolemia, on statin therapy  8. Abdominal distention: improved    Continue ACEi and oral diuretics. Recheck echo on Monday. If LVEF returns fully,not sure beta blockers will be of help. If this was stress cardiomyopathy, would expect no long term sequelae.  Donato Schultz, MD   03/20/2015, 8:33 AM
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
## 2015-03-21 ENCOUNTER — Inpatient Hospital Stay (HOSPITAL_COMMUNITY): Payer: Medicare Other

## 2015-03-21 ENCOUNTER — Other Ambulatory Visit (HOSPITAL_COMMUNITY): Payer: Medicare Other

## 2015-03-21 LAB — CBC
HCT: 38.8 % (ref 36.0–46.0)
HEMOGLOBIN: 12.5 g/dL (ref 12.0–15.0)
MCH: 28.5 pg (ref 26.0–34.0)
MCHC: 32.2 g/dL (ref 30.0–36.0)
MCV: 88.6 fL (ref 78.0–100.0)
PLATELETS: 192 10*3/uL (ref 150–400)
RBC: 4.38 MIL/uL (ref 3.87–5.11)
RDW: 14.2 % (ref 11.5–15.5)
WBC: 4.9 10*3/uL (ref 4.0–10.5)

## 2015-03-21 LAB — BASIC METABOLIC PANEL
ANION GAP: 12 (ref 5–15)
BUN: 16 mg/dL (ref 6–20)
CHLORIDE: 98 mmol/L — AB (ref 101–111)
CO2: 30 mmol/L (ref 22–32)
Calcium: 9.4 mg/dL (ref 8.9–10.3)
Creatinine, Ser: 0.91 mg/dL (ref 0.44–1.00)
GFR calc Af Amer: >60 mL/min (ref >60)
Glucose, Bld: 87 mg/dL (ref 65–99)
POTASSIUM: 4.5 mmol/L (ref 3.5–5.1)
SODIUM: 140 mmol/L (ref 135–145)

## 2015-03-21 LAB — GLUCOSE, CAPILLARY: Glucose-Capillary: 174 mg/dL — ABNORMAL HIGH (ref 65–99)

## 2015-03-21 MED ORDER — BISACODYL 10 MG RE SUPP
10.0000 mg | Freq: Every day | RECTAL | Status: DC
  Administered 2015-03-21 – 2015-03-23 (×2): 10 mg via RECTAL
  Filled 2015-03-21 (×4): qty 1

## 2015-03-21 MED ORDER — CARVEDILOL 3.125 MG PO TABS
3.1250 mg | ORAL_TABLET | Freq: Two times a day (BID) | ORAL | Status: DC
  Administered 2015-03-22 – 2015-03-25 (×6): 3.125 mg via ORAL
  Filled 2015-03-21 (×7): qty 1

## 2015-03-21 MED ORDER — POTASSIUM CHLORIDE IN NACL 40-0.9 MEQ/L-% IV SOLN
INTRAVENOUS | Status: DC
Start: 2015-03-21 — End: 2015-03-22
  Administered 2015-03-21 – 2015-03-22 (×2): 50 mL/h via INTRAVENOUS
  Filled 2015-03-21 (×2): qty 1000

## 2015-03-21 MED ORDER — FUROSEMIDE 40 MG PO TABS
40.0000 mg | ORAL_TABLET | Freq: Two times a day (BID) | ORAL | Status: DC
  Administered 2015-03-22: 40 mg via ORAL
  Filled 2015-03-21: qty 1

## 2015-03-21 NOTE — Progress Notes (Signed)
Pt trans to 3W from ICU this weekend. CSW has been following to assist with d/c planning. Pt is from [facility]. Family would like pt to return to ALF, if possible. Pt was w/c bound at ALF and required assistance with all ADLs. CSW will continue to follow to assist with d/c planning.  Cori Razor LCSW [phone]
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
## 2015-03-21 NOTE — Care Management Important Message (Signed)
Important Message  Patient Details  Name: Anneisha Munzer MRN: [id number] Date of Birth: 28-May-1951   Medicare Important Message Given:  [facility] notification given    Haskell Flirt 03/21/2015, 12:08 PMImportant Message  Patient Details  Name: Breena Hagadorn MRN: [id number] Date of Birth: August 09, 1950   Medicare Important Message Given:  Yes-second notification given    Haskell Flirt 03/21/2015, 12:08 PM
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
## 2015-03-21 NOTE — Plan of Care (Signed)
Problem: Food- and Nutrition-Related Knowledge Deficit (NB-1.1) Goal: Nutrition education Formal process to instruct or train a patient/client in a skill or to impart knowledge to help patients/clients voluntarily manage or modify food choices and eating behavior to maintain or improve health.  Outcome: Completed/Met Date Met:  03/21/15 Nutrition Education Note  RD consulted for nutrition education regarding chronic constipation.  RD provided "Constipation Nutrition Therapy" handout from the Academy of [employer]etics. Reviewed patient's dietary recall. Provided examples on ways to increase fiber intake in diet. Encouraged fresh fruits and vegetables as well as whole grain sources of carbohydrates to maximize fiber intake. Encouraged physical activity when available. Encouraged at least 8 cups of water daily.Teach back method used.  Expect good compliance if patient had control over food she is served at home. Pt with many barriers. Pt is quadriplegic and finds it difficult to find activities.   Body mass index is 23.36 kg/(m^2). Pt meets criteria for normal range based on current BMI.  Current diet order is CLD, patient is consuming approximately 100% of meals at this time. Labs and medications reviewed. No further nutrition interventions warranted at this time. RD contact information provided. If additional nutrition issues arise, please re-consult RD.  Clayton Bibles, MS, RD, LDN Pager: [phone] After Hours Pager: [phone]

## 2015-03-21 NOTE — Progress Notes (Signed)
SLP Cancellation Note  Patient Details Name: Shandrica Praska MRN: [id number] DOB: [birth year]   Cancelled treatment:       Reason Eval/Treat Not Completed: Other (comment);Medical issues which prohibited therapy (pt npo, ? for bowel issues, will continue efforts)   Donavan Burnet, MS [facility] SLP [phone]

## 2015-03-22 ENCOUNTER — Telehealth: Payer: Self-pay | Admitting: Cardiology

## 2015-03-22 ENCOUNTER — Inpatient Hospital Stay (HOSPITAL_COMMUNITY): Payer: Medicare Other

## 2015-03-22 DIAGNOSIS — I428 Other cardiomyopathies: Secondary | ICD-10-CM

## 2015-03-22 DIAGNOSIS — I214 Non-ST elevation (NSTEMI) myocardial infarction: Secondary | ICD-10-CM

## 2015-03-22 MED ORDER — FUROSEMIDE 20 MG PO TABS
20.0000 mg | ORAL_TABLET | Freq: Every day | ORAL | Status: DC
  Administered 2015-03-23 – 2015-03-25 (×3): 20 mg via ORAL
  Filled 2015-03-22 (×3): qty 1

## 2015-03-22 MED ORDER — MORPHINE SULFATE ER 15 MG PO TBCR
15.0000 mg | EXTENDED_RELEASE_TABLET | Freq: Two times a day (BID) | ORAL | Status: DC
  Administered 2015-03-22 – 2015-03-25 (×6): 15 mg via ORAL
  Filled 2015-03-22 (×6): qty 1

## 2015-03-22 NOTE — Clinical Social Work Placement (Signed)
   CLINICAL SOCIAL WORK PLACEMENT  NOTE  Date:  03/22/2015  Patient Details  Name: Deanna Morgan MRN: [id number] Date of Birth: 1951/07/04  Clinical Social Work is seeking post-discharge placement for this patient at the Skilled  Nursing Facility level of care (*CSW will initial, date and re-position this form in  chart as items are completed):  Yes   Patient/family provided with [facility] Social Work Department's list of facilities offering this level of care within the geographic area requested by the patient (or if unable, by the patient's family).  Yes   Patient/family informed of their freedom to choose among providers that offer the needed level of care, that participate in Medicare, Medicaid or managed care program needed by the patient, have an available bed and are willing to accept the patient.  Yes   Patient/family informed of [place]'s ownership interest in [facility] and [facility], as well as of the fact that they are under no obligation to receive care at these facilities.  PASRR submitted to EDS on 03/22/15     PASRR number received on 03/22/15     Existing PASRR number confirmed on       FL2 transmitted to all facilities in geographic area requested by pt/family on 03/22/15     FL2 transmitted to all facilities within larger geographic area on       Patient informed that his/her managed care company has contracts with or will negotiate with certain facilities, including the following:            Patient/family informed of bed offers received.  Patient chooses bed at       Physician recommends and patient chooses bed at      Patient to be transferred to   on  .  Patient to be transferred to facility by       Patient family notified on   of transfer.  Name of family member notified:        PHYSICIAN Please sign FL2     Additional Comment:    _______________________________________________ Orson Eva, LCSW 03/22/2015,  3:53 PM
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
## 2015-03-22 NOTE — Care Management Note (Signed)
Case Management Note  Patient Details  Name: Rylin Ohlmann MRN: [id number] Date of Birth: Jun 02, 1951  Subjective/Objective:     64 yo admitted with                Action/Plan: Pt is from [facility] and plans to return there.  Expected Discharge Date:                  Expected Discharge Plan:  Assisted Living / Rest Home  In-House Referral:  Clinical Social Work  Discharge planning Services  CM Consult  Post Acute Care Choice:  NA Choice offered to:  NA  DME Arranged:    DME Agency:     HH Arranged:    HH Agency:     Status of Service:  In process, will continue to follow  Medicare Important Message Given:  Yes-second notification given Date Medicare IM Given:    Medicare IM give by:    Date Additional Medicare IM Given:    Additional Medicare Important Message give by:     If discussed at Long Length of Stay Meetings, dates discussed:    Additional Comments:  Bartholome Bill, RN 03/22/2015, 3:08 PM
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
## 2015-03-22 NOTE — Progress Notes (Signed)
OT Cancellation Note  Patient Details Name: [name] MRN: [id number] DOB: 25-Jan-1951   Cancelled Treatment:    Reason Eval/Treat Not Completed: OT screened, no needs identified, will sign off.  Per chart, pt had assistance with all adls at alf.  Marina Boerner 03/22/2015, 10:47 AM  Marica Otter, OTR/L [phone] 03/22/2015

## 2015-03-22 NOTE — Telephone Encounter (Signed)
Merlyn Albert, RN called from [facility] about patient. Coronary CT was ordered today. Nurses are unable to get an 18 g IV, which is required for test. Merlyn Albert, RN requests a phone call from Dr. Mayford Knife with orders to change test to something that does not require an 18 g, or to order a PICC line.  Merlyn Albert, RN # [phone]  To Dr. Mayford Knife.

## 2015-03-22 NOTE — Progress Notes (Signed)
PT Cancellation Note  Patient Details Name: Deanna Morgan MRN: [id number] DOB: 1950-10-08   Cancelled Treatment:    Reason Eval/Treat Not Completed: PT screened, no needs identified, will sign off (from ALF, WC bound, total care/)   Rada Hay 03/22/2015, 10:41 AM Blanchard Kelch PT [phone]
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
## 2015-03-22 NOTE — Progress Notes (Signed)
  Echocardiogram 2D Echocardiogram limited has been performed.  Deanna Morgan 03/22/2015, 10:04 AM

## 2015-03-22 NOTE — Telephone Encounter (Signed)
New message   Pt ct of the heart  Please call Merlyn Albert pt RN  [phone]

## 2015-03-22 NOTE — Telephone Encounter (Signed)
Per Dr. Mayford Knife, gave Merlyn Albert, RN, verbal order to start PICC line.
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
## 2015-03-23 DIAGNOSIS — J189 Pneumonia, unspecified organism: Secondary | ICD-10-CM

## 2015-03-23 DIAGNOSIS — G8250 Quadriplegia, unspecified: Secondary | ICD-10-CM

## 2015-03-23 MED ORDER — SODIUM CHLORIDE 0.9 % IV BOLUS (SEPSIS)
500.0000 mL | Freq: Once | INTRAVENOUS | Status: AC
  Administered 2015-03-23: 500 mL via INTRAVENOUS

## 2015-03-23 MED ORDER — SODIUM CHLORIDE 0.9 % IJ SOLN: 10.0000 mL | INTRAMUSCULAR | Status: DC | PRN

## 2015-03-23 MED ORDER — SODIUM CHLORIDE 0.9 % IJ SOLN
10.0000 mL | Freq: Two times a day (BID) | INTRAMUSCULAR | Status: DC
  Administered 2015-03-25: 10 mL

## 2015-03-23 NOTE — Progress Notes (Signed)
CT staff called me back,per Dr. Lorne Skeens has to be in CT at [facility] 8 am tom. I called [facility] and set up for transportation in am, [facility] will be here at 0730. I will endorsed to night nurse.

## 2015-03-23 NOTE — Progress Notes (Signed)
I called CT at [facility] earlier and asked them if CT coronary can be done today. I spoke to [place]- CT staff,he called Dr. Delton See - cardiologist to know if procedure can be performed tonight,accdg to Doristine Church, MD said it was okay,but after awhile I got a call from  [facility] and told this Clinical research associate that they don't have a nurse for tonight and he will notify Dr. Delton See about this. Awaiting for CT staff to call me, to know what time we can transfer patient to [facility] CT in am.

## 2015-03-23 NOTE — Progress Notes (Signed)
Patient c/o bladder spasm also foley had leaked and soaked her pads. Scanned  Bladder result was 0,has a F14 foley earlier. Foley discontinued. Inserted F 16,.yellow colored urine noted in the foley bag,patient tolerated the procedure. MD aware re: this matter. Will continue to monitor.

## 2015-03-23 NOTE — Progress Notes (Signed)
CSW continuing to follow.   CSW followed up with pt at bedside. CSW provided SNF bed offers and included private pay rates as it is not likely that [facility] will cover SNF.   CSW contacted pt daughter, [name] via telephone. Pt daughter states that she will call CSW back in order for CSW to provide SNF bed offers.   CSW received phone call from [facility] and updated facility that pt and pt family are exploring SNF options.   Per MD, pt not yet medically ready for discharge.  CSW to continue to follow to provide support and assist with pt disposition planning.  Loletta Specter, MSW, LCSW Clinical Social Work [phone]
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
## 2015-03-23 NOTE — Progress Notes (Signed)
Patient's BP was 80/50,notified the MD,new order received for 500cc NS bolus,infusing at this time. Will continue to monitor.

## 2015-03-23 NOTE — Progress Notes (Signed)
Peripherally Inserted Central Catheter/Midline Placement  The IV Nurse has discussed with the patient and/or persons authorized to consent for the patient, the purpose of this procedure and the potential benefits and risks involved with this procedure.  The benefits include less needle sticks, lab draws from the catheter and patient may be discharged home with the catheter.  Risks include, but not limited to, infection, bleeding, blood clot (thrombus formation), and puncture of an artery; nerve damage and irregular heat beat.  Alternatives to this procedure were also discussed.  PICC/Midline Placement Documentation  PICC / Midline Double Lumen 03/23/15 PICC Left Basilic 45 cm 0 cm (Active)  Indication for Insertion or Continuance of Line Poor Vasculature-patient has had multiple peripheral attempts or PIVs lasting less than 24 hours 03/23/2015  4:00 PM  Exposed Catheter (cm) 0 cm 03/23/2015  4:00 PM  Dressing Change Due 03/30/15 03/23/2015  4:00 PM       Stacie Glaze Horton 03/23/2015, 5:00 PM

## 2015-03-24 ENCOUNTER — Ambulatory Visit (HOSPITAL_COMMUNITY)
Admit: 2015-03-24 | Discharge: 2015-03-24 | Disposition: A | Discharge status: Home / Self Care | Payer: Medicare Other | Attending: Cardiology | Admitting: Cardiology

## 2015-03-24 ENCOUNTER — Encounter (HOSPITAL_COMMUNITY): Payer: Self-pay

## 2015-03-24 DIAGNOSIS — K567 Ileus, unspecified: Secondary | ICD-10-CM

## 2015-03-24 DIAGNOSIS — I214 Non-ST elevation (NSTEMI) myocardial infarction: Secondary | ICD-10-CM

## 2015-03-24 DIAGNOSIS — K560 Paralytic ileus: Secondary | ICD-10-CM

## 2015-03-24 HISTORY — DX: Heart failure, unspecified: I50.9

## 2015-03-24 LAB — BASIC METABOLIC PANEL
Anion gap: 7 (ref 5–15)
BUN: 22 mg/dL — ABNORMAL HIGH (ref 6–20)
CHLORIDE: 105 mmol/L (ref 101–111)
CO2: 28 mmol/L (ref 22–32)
Calcium: 8.8 mg/dL — ABNORMAL LOW (ref 8.9–10.3)
Creatinine, Ser: 0.6 mg/dL (ref 0.44–1.00)
Glucose, Bld: 92 mg/dL (ref 65–99)
POTASSIUM: 4.2 mmol/L (ref 3.5–5.1)
SODIUM: 140 mmol/L (ref 135–145)

## 2015-03-24 LAB — CBC
HEMATOCRIT: 37.1 % (ref 36.0–46.0)
Hemoglobin: 11.8 g/dL — ABNORMAL LOW (ref 12.0–15.0)
MCH: 28.4 pg (ref 26.0–34.0)
MCHC: 31.8 g/dL (ref 30.0–36.0)
MCV: 89.4 fL (ref 78.0–100.0)
Platelets: 186 10*3/uL (ref 150–400)
RBC: 4.15 MIL/uL (ref 3.87–5.11)
RDW: 14.4 % (ref 11.5–15.5)
WBC: 5.3 10*3/uL (ref 4.0–10.5)

## 2015-03-24 MED ORDER — NITROGLYCERIN 0.4 MG SL SUBL
SUBLINGUAL_TABLET | SUBLINGUAL | Status: AC
  Administered 2015-03-24: 0.8 mg via SUBLINGUAL
  Filled 2015-03-24: qty 1

## 2015-03-24 MED ORDER — NITROGLYCERIN 0.4 MG SL SUBL
0.8000 mg | SUBLINGUAL_TABLET | SUBLINGUAL | Status: DC | PRN
  Administered 2015-03-24: 0.8 mg via SUBLINGUAL
  Filled 2015-03-24: qty 25

## 2015-03-24 MED ORDER — IOHEXOL 350 MG/ML SOLN
80.0000 mL | Freq: Once | INTRAVENOUS | Status: AC | PRN
Start: 2015-03-24 — End: 2015-03-24
  Administered 2015-03-24: 80 mL via INTRAVENOUS
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
## 2015-03-24 NOTE — Progress Notes (Signed)
CSW continuing to follow.  CSW had provided SNF bed offers to pt and pt family, but now pt family is expressing their wishes for pt to return to [facility]. Pt son-in-law at bedside and states that he feel that pt is at baseline and [facility] is pt "home" and wants pt to return. CSW discussed with pt son that [facility] is reviewing pt clinical information and will notify CSW if facility feels they can continue to meet pt needs. [facility] will notify CSW in the morning.   CSW spoke with MD and anticipate discharge tomorrow.   CSW to follow up with pt and pt family tomorrow.   CSW to continue to follow.  Loletta Specter, MSW, LCSW Clinical Social Work [phone]

## 2015-03-24 NOTE — Progress Notes (Signed)
Patient transported to [facility] CT via carelink for CT coronary.

## 2015-03-24 NOTE — Progress Notes (Signed)
   03/24/15 1200  Clinical Encounter Type  Visited With Patient  Visit Type Follow-up  Consult/Referral To Chaplain  Spiritual Encounters  Spiritual Needs Emotional;Other (Comment) (Pastoral Conversation)   Chaplain followed up with the patient from a previous visit. The patient was on the phone and sitting up in bed upon the Chaplains arrival.  The Chaplain let the patient know that she would return at a better time. The Chaplain will follow up with the patient at a better time.

## 2015-03-24 NOTE — Care Management Important Message (Signed)
Important Message  Patient Details  Name: Vittoria Wait MRN: [id number] Date of Birth: 12-29-1950   Medicare Important Message Given:  Yes-third notification given    Haskell Flirt 03/24/2015, 1:16 PMImportant Message  Patient Details  Name: Helane Vanroy MRN: [id number] Date of Birth: Sep 05, 1950   Medicare Important Message Given:  Yes-third notification given    Haskell Flirt 03/24/2015, 1:16 PM

## 2015-03-24 NOTE — Progress Notes (Signed)
Nonobstructive CAD by coronary CTA. Needs aggressive risk factor modification.  Continue ASA/BB and add statin.  Followup with me in office in 4 weeks. Will sign off.

## 2015-03-25 DIAGNOSIS — R100 Acute abdomen: Secondary | ICD-10-CM

## 2015-03-25 MED ORDER — TUBERCULIN PPD 5 UNIT/0.1ML ID SOLN
5.0000 [IU] | Freq: Once | INTRADERMAL | Status: DC
  Administered 2015-03-25: 5 [IU] via INTRADERMAL
  Filled 2015-03-25: qty 0.1

## 2015-03-25 MED ORDER — ALPRAZOLAM 0.5 MG PO TABS: 0.5000 mg | ORAL_TABLET | Freq: Three times a day (TID) | ORAL | Status: DC | PRN

## 2015-03-25 MED ORDER — MORPHINE SULFATE ER 15 MG PO TBCR: 15.0000 mg | EXTENDED_RELEASE_TABLET | Freq: Two times a day (BID) | ORAL | Status: DC

## 2015-03-25 MED ORDER — LISINOPRIL 10 MG PO TABS: 5.0000 mg | ORAL_TABLET | Freq: Every day | ORAL | Status: DC

## 2015-03-25 MED ORDER — SODIUM CHLORIDE 0.9 % IV BOLUS (SEPSIS)
500.0000 mL | Freq: Once | INTRAVENOUS | Status: AC
  Administered 2015-03-25: 500 mL via INTRAVENOUS

## 2015-03-25 MED ORDER — LISINOPRIL 5 MG PO TABS: 5.0000 mg | ORAL_TABLET | Freq: Every day | ORAL | Status: DC

## 2015-03-25 MED ORDER — CARVEDILOL 3.125 MG PO TABS: 3.1250 mg | ORAL_TABLET | Freq: Two times a day (BID) | ORAL | Status: DC

## 2015-03-25 MED ORDER — FLEET ENEMA 7-19 GM/118ML RE ENEM
1.0000 | ENEMA | Freq: Once | RECTAL | Status: AC
  Administered 2015-03-25: 1 via RECTAL
  Filled 2015-03-25: qty 1

## 2015-03-25 NOTE — Progress Notes (Signed)
AVS discussed with patient and family; understanding verbalized.  Patient transferred to [facility] via private vehicles.

## 2015-03-25 NOTE — Progress Notes (Signed)
MD placed order to d/c LUE double lumen PICC prior to discharge.  IV team notified
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
## 2015-03-25 NOTE — Progress Notes (Signed)
Report called to [facility].  Spoke to [place], Charity fundraiser.

## 2015-03-25 NOTE — Clinical Social Work Placement (Signed)
   CLINICAL SOCIAL WORK PLACEMENT  NOTE  Date:  03/25/2015  Patient Details  Name: Deanna Morgan MRN: [id number] Date of Birth: Apr 12, 1951  Clinical Social Work is seeking post-discharge placement for this patient at the Assisted Living Facility level of care (*CSW will initial, date and re-position this form in  chart as items are completed):  Yes   Patient/family provided with [facility] Clinical Social Work Department's list of facilities offering this level of care within the geographic area requested by the patient (or if unable, by the patient's family).  Yes   Patient/family informed of their freedom to choose among providers that offer the needed level of care, that participate in Medicare, Medicaid or managed care program needed by the patient, have an available bed and are willing to accept the patient.  Yes   Patient/family informed of [facility]'s ownership interest in [facility] and [facility], as well as of the fact that they are under no obligation to receive care at these facilities.  PASRR submitted to EDS on 03/22/15     PASRR number received on 03/22/15     Existing PASRR number confirmed on       FL2 transmitted to all facilities in geographic area requested by pt/family on 03/22/15     FL2 transmitted to all facilities within larger geographic area on       Patient informed that his/her managed care company has contracts with or will negotiate with certain facilities, including the following:        Yes   Patient/family informed of bed offers received.  Patient chooses bed at [facility]     Physician recommends and patient chooses bed at      Patient to be transferred to [facility] on 03/25/15.  Patient to be transferred to facility by pt son-in-law via private vehicle     Patient family notified on 03/25/15 of transfer.  Name of family member notified:  pt and pt son-in-law notified at bedside      PHYSICIAN Please sign FL2     Additional Comment:    _______________________________________________ Orson Eva, LCSW 03/25/2015, 6:33 PM

## 2015-03-25 NOTE — Progress Notes (Signed)
CSW following pt.   CSW received notification this morning from the facility that pt admitted from, [facility] that facility does not feel that they can continue to meet pt needs. CSW discussed with pt family and pt family wished to meet with [facility] again to advocate for pt return. Ultimately, [facility] still stated that they could not accept pt back.  CSW reviewed SNF bed offers with pt daughter, [name] and faxed information to [facility] per pt daughter request. [facility] was able to offer pt a bed and can accept pt today. Pt and pt family expressed frustration that pt will not be able to return to [facility], but agreeable to discharge to [facility] as private pay.   CSW had received a 3 day auth from [facility] if pt went to skilled level of care, but pt discharging to ALF level of care and CSW notified [facility] Medicare in order for auth request to be cancelled.  CSW facilitated pt discharge needs including contacting facility, faxing pt discharge information and confirming facility received and reviewed information, providing RN phone number to call report, and providing discharge packet to pt son-in-law at bedside to provide to [facility] upon arrival to facility. Pt son-in-law plans to transport via private vehicle.  No further social work needs identified at this time.  CSW signing off.   Loletta Specter, MSW, LCSW Clinical Social Work [phone]

## 2015-04-07 ENCOUNTER — Emergency Department (HOSPITAL_COMMUNITY)
Admission: EM | Admit: 2015-04-07 | Discharge: 2015-04-07 | Disposition: A | Discharge status: Home / Self Care | Payer: Medicare Other | Attending: Emergency Medicine | Admitting: Emergency Medicine

## 2015-04-07 ENCOUNTER — Encounter (HOSPITAL_COMMUNITY): Payer: Self-pay | Admitting: *Deleted

## 2015-04-07 DIAGNOSIS — F419 Anxiety disorder, unspecified: Secondary | ICD-10-CM | POA: Insufficient documentation

## 2015-04-07 DIAGNOSIS — K5901 Slow transit constipation: Secondary | ICD-10-CM | POA: Insufficient documentation

## 2015-04-07 DIAGNOSIS — I10 Essential (primary) hypertension: Secondary | ICD-10-CM | POA: Diagnosis not present

## 2015-04-07 DIAGNOSIS — Z79899 Other long term (current) drug therapy: Secondary | ICD-10-CM | POA: Insufficient documentation

## 2015-04-07 DIAGNOSIS — Z87440 Personal history of urinary (tract) infections: Secondary | ICD-10-CM | POA: Diagnosis not present

## 2015-04-07 DIAGNOSIS — K5900 Constipation, unspecified: Secondary | ICD-10-CM | POA: Diagnosis present

## 2015-04-07 DIAGNOSIS — Z8719 Personal history of other diseases of the digestive system: Secondary | ICD-10-CM | POA: Insufficient documentation

## 2015-04-07 DIAGNOSIS — Z7982 Long term (current) use of aspirin: Secondary | ICD-10-CM | POA: Diagnosis not present

## 2015-04-07 DIAGNOSIS — E785 Hyperlipidemia, unspecified: Secondary | ICD-10-CM | POA: Diagnosis not present

## 2015-04-07 DIAGNOSIS — G8929 Other chronic pain: Secondary | ICD-10-CM | POA: Diagnosis not present

## 2015-04-07 DIAGNOSIS — I509 Heart failure, unspecified: Secondary | ICD-10-CM | POA: Diagnosis not present

## 2015-04-07 DIAGNOSIS — Z8669 Personal history of other diseases of the nervous system and sense organs: Secondary | ICD-10-CM | POA: Diagnosis not present

## 2015-04-07 HISTORY — DX: Takotsubo syndrome: I51.81

## 2015-04-07 MED ORDER — LACTULOSE 10 GM/15ML PO SOLN
30.0000 g | Freq: Once | ORAL | Status: AC
  Administered 2015-04-07: 30 g via ORAL
  Filled 2015-04-07: qty 45

## 2015-04-07 MED ORDER — SODIUM CHLORIDE 0.9 % IV BOLUS (SEPSIS)
500.0000 mL | Freq: Once | INTRAVENOUS | Status: AC
  Administered 2015-04-07: 500 mL via INTRAVENOUS

## 2015-04-07 MED ORDER — LACTULOSE 10 GM/15ML PO SOLN: 20.0000 g | Freq: Three times a day (TID) | ORAL | Status: DC

## 2015-04-07 MED ORDER — BISACODYL 10 MG RE SUPP
10.0000 mg | Freq: Once | RECTAL | Status: AC
  Administered 2015-04-07: 10 mg via RECTAL
  Filled 2015-04-07: qty 1

## 2015-04-07 MED ORDER — FLEET ENEMA 7-19 GM/118ML RE ENEM
1.0000 | ENEMA | Freq: Once | RECTAL | Status: AC
  Administered 2015-04-07: 1 via RECTAL
  Filled 2015-04-07: qty 1

## 2015-04-07 NOTE — ED Notes (Addendum)
Pt recently admitted to the hospital for sepsis and PNA. Per ems pt is from [facility] on [address]. Pt c/o constipation x6 days, pt paraplegic, has some function of her arms, loss of hand function.   Upon rn assessment, pt has continual foley, was started on Cipro a few days ago, she is unsure reason. Pt denies pain. Reports constipation x6 days. Alert and oriented x4. Bowel sounds present

## 2015-04-07 NOTE — ED Notes (Signed)
Bed: WA23 Expected date:  Expected time:  Means of arrival:  Comments: EMS constipation 

## 2015-04-07 NOTE — ED Provider Notes (Signed)
CSN: [id number]     Arrival date & time 04/07/15  4098 History   First MD Initiated Contact with Patient 04/07/15 [phone]     Chief Complaint  Patient presents with  . Constipation     (Consider location/radiation/quality/duration/timing/severity/associated sxs/prior Treatment) HPI   Deanna Morgan is a 64 y.o. female who presents for evaluation of constipation. She was hospitalized earlier this month, with sepsis, and ultimately diagnosed with Takatsubo's  cardiomyopathy, and acute congestive heart failure. She was discharged about 2 weeks ago. She is at a assisted living facility. She has been unable to have a bowel movement for 6 days despite using Colace and MiraLAX. She denies vomiting, fever, chills, new weakness or dizziness. There are no other known modifying factors.  Past Medical History  Diagnosis Date  . Chronic pain   . Chronic constipation   . Chronic UTI   . Syringomyelia   . Paralysis   . Hyperlipidemia   . Hypertension   . Peripheral vascular disease   . GERD (gastroesophageal reflux disease)   . Neuromuscular disorder   . Anxiety   . Shortness of breath dyspnea   . CHF (congestive heart failure)   . 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 2016   History reviewed. No pertinent past surgical history. History reviewed. No pertinent family history. Social History  Substance Use Topics  . Smoking status: Never Smoker   . Smokeless tobacco: None  . Alcohol Use: No   OB History    No data available     Review of Systems  All other systems reviewed and are negative.     Allergies  Review of patient's allergies indicates no known allergies.  Home Medications   Prior to Admission medications   Medication Sig Start Date End Date Taking? Authorizing Provider  acidophilus (RISAQUAD) CAPS capsule Take 1 capsule by mouth 2 (two) times daily.   Yes Historical Provider, MD  ALPRAZolam Prudy Feeler) 0.5 MG tablet Take 1 tablet (0.5 mg total) by mouth every 8 (eight) hours as  needed for anxiety. 03/25/15  Yes Jeralyn Bennett, MD  aspirin 81 MG tablet Take 81 mg by mouth daily.   Yes Historical Provider, MD  baclofen (LIORESAL) 20 MG tablet Take 20 mg by mouth 2 (two) times daily.   Yes Historical Provider, MD  bisacodyl (LAXATIVE) 10 MG suppository Place 10 mg rectally as needed for mild constipation or moderate constipation.   Yes Historical Provider, MD  Chloroxylenol-Zinc Oxide (BAZA EX) Apply 1 application topically 2 (two) times daily as needed (for buttocks). Apply to buttocks until healed   Yes Historical Provider, MD  ciprofloxacin (CIPRO) 250 MG tablet Take 250 mg by mouth 2 (two) times daily. For 10 days 04/05/15  Yes Historical Provider, MD  Cranberry 425 MG CAPS Take 425 mg by mouth 2 (two) times daily.   Yes Historical Provider, MD  docusate sodium (COLACE) 100 MG capsule Take 100 mg by mouth 2 (two) times daily.   Yes Historical Provider, MD  ketoconazole (NIZORAL) 2 % shampoo Apply 1 application topically 2 (two) times a week. Provide and apply on skin as directed. Lather and let sit for a few minutes prior to rinsing when showering.   Yes Historical Provider, MD  magnesium citrate SOLN Take 1 Bottle by mouth at bedtime as needed for severe constipation.   Yes Historical Provider, MD  Mineral Oil Heavy OIL Place 2 drops into both ears once a week.   Yes Historical Provider, MD  morphine (MS CONTIN) 15 MG  12 hr tablet Take 1 tablet (15 mg total) by mouth every 12 (twelve) hours. 03/25/15  Yes Jeralyn Bennett, MD  Multiple Vitamin (DAILY VITE PO) Take 1 tablet by mouth daily.   Yes Historical Provider, MD  Omega 3 1000 MG CAPS Take 1,000 mg by mouth daily.   Yes Historical Provider, MD  oxybutynin (DITROPAN XL) 15 MG 24 hr tablet Take 15 mg by mouth daily.   Yes Historical Provider, MD  polyethylene glycol (MIRALAX / GLYCOLAX) packet Take 17 g by mouth daily as needed for mild constipation or moderate constipation. Mix 1 capful (17g) with 8 oz of fluid   Yes  Historical Provider, MD  polyethylene glycol (MIRALAX / GLYCOLAX) packet Take 17 g by mouth every Monday, Wednesday, and Friday.   Yes Historical Provider, MD  Psyllium 500 MG CAPS Take 2 capsules by mouth at bedtime.   Yes Historical Provider, MD  senna (SENOKOT) 8.6 MG TABS tablet Take 1 tablet by mouth at bedtime.   Yes Historical Provider, MD  sertraline (ZOLOFT) 50 MG tablet Take 50 mg by mouth daily with breakfast.    Yes Historical Provider, MD  simvastatin (ZOCOR) 40 MG tablet Take 40 mg by mouth at bedtime.   Yes Historical Provider, MD  carvedilol (COREG) 3.125 MG tablet Take 1 tablet (3.125 mg total) by mouth 2 (two) times daily with a meal. Patient not taking: Reported on 04/07/2015 03/25/15   Jeralyn Bennett, MD  lactulose (CHRONULAC) 10 GM/15ML solution Take 30 mLs (20 g total) by mouth 3 (three) times daily. 04/07/15   Mancel Bale, MD   BP 112/77 mmHg  Pulse 67  Temp(Src) 98.3 F (36.8 C) (Oral)  Resp 18  Wt 139 lb (63.05 kg)  SpO2 100% Physical Exam  Constitutional: She is oriented to person, place, and time. She appears well-developed and well-nourished. No distress.  HENT:  Head: Normocephalic and atraumatic.  Right Ear: External ear normal.  Left Ear: External ear normal.  Eyes: Conjunctivae and EOM are normal. Pupils are equal, round, and reactive to light.  Neck: Normal range of motion and phonation normal. Neck supple.  Cardiovascular: Normal rate, regular rhythm and normal heart sounds.   Pulmonary/Chest: Effort normal and breath sounds normal. She exhibits no bony tenderness.  Abdominal: Soft. There is no tenderness.  Genitourinary:  Anus normal. Rectal vault with very small amount of brown stool. No rectal mass.  Musculoskeletal: Normal range of motion.  Neurological: She is alert and oriented to person, place, and time. No cranial nerve deficit or sensory deficit. Coordination normal.  Quadriparesis, with better movement left arm, than right.  Skin: Skin is  warm, dry and intact.  Psychiatric: She has a normal mood and affect. Her behavior is normal. Judgment and thought content normal.  Nursing note and vitals reviewed.   ED Course  Procedures (including critical care time)  Medications  lactulose (CHRONULAC) 10 GM/15ML solution 30 g (30 g Oral Given 04/07/15 1103)  sodium chloride 0.9 % bolus 500 mL (0 mLs Intravenous Stopped 04/07/15 1235)  bisacodyl (DULCOLAX) suppository 10 mg (10 mg Rectal Given 04/07/15 1501)  sodium phosphate (FLEET) 7-19 GM/118ML enema 1 enema (1 enema Rectal Given 04/07/15 1501)    Patient Vitals for the past 24 hrs:  BP Temp Temp src Pulse Resp SpO2 Weight  04/07/15 1604 112/77 mmHg - - 67 18 100 % -  04/07/15 1600 112/77 mmHg - - - 13 - -  04/07/15 1438 106/57 mmHg - - 69 18 100 % -  04/07/15 1200 124/64 mmHg - - 62 17 98 % -  04/07/15 1130 97/82 mmHg - - 64 17 97 % -  04/07/15 1120 (!) 89/57 mmHg - - 67 14 96 % -  04/07/15 1116 (!) 96/51 mmHg - - 75 17 100 % -  04/07/15 1106 (!) 83/44 mmHg - - - - - -  04/07/15 1104 (!) 75/49 mmHg - - 74 21 - -  04/07/15 1100 94/66 mmHg - - 72 21 - -  04/07/15 1006 - - - - - - 139 lb (63.05 kg)  04/07/15 0948 103/59 mmHg 98.3 F (36.8 C) Oral 67 18 97 % -    4:40 PM Reevaluation with update and discussion. After initial assessment and treatment, an updated evaluation reveals she remains comfortable. No vomiting. She states that she has chronic constipation. No additional complaints. Findings discussed with the patient, all questions were answered. WENTZ,ELLIOTT L    Labs Review Labs Reviewed - No data to display  Imaging Review No results found. I have personally reviewed and evaluated these images and lab results as part of my medical decision-making.   EKG Interpretation None      MDM   Final diagnoses:  Slow transit constipation    Constipation without adverse findings. She is stable for discharge. Doubt serious bacterial infection. Metabolic instability  or impending vascular collapse.   Nursing Notes Reviewed/ Care Coordinated Applicable Imaging Reviewed Interpretation of Laboratory Data incorporated into ED treatment  The patient appears reasonably screened and/or stabilized for discharge and I doubt any other medical condition or other [facility]: [facility] W requiring further screening, evaluation, or treatment in the ED at this time prior to discharge.  Plan: Home Medications- lactulose 3 times a day; Home Treatments- increase oral fluids; return here if the recommended treatment, does not improve the symptoms; Recommended follow up- PCP 1 week and when necessary     Mancel Bale, MD 04/07/15 [phone]

## 2015-04-07 NOTE — ED Notes (Signed)
Pt is up for discharge. Pt does not want [facility] to be called for transport, wants to call her family. Pt states she has made multiple attempts to call her family and is awaiting a call back.

## 2015-04-07 NOTE — ED Notes (Signed)
md made aware of pts BP

## 2015-04-07 NOTE — Discharge Instructions (Signed)
Use the lactulose 3 times a day, to encourage stooling. Use an enema once or twice a day to help stooling.   Constipation Constipation is when a person has fewer than three bowel movements a week, has difficulty having a bowel movement, or has stools that are dry, hard, or larger than normal. As people grow older, constipation is more common. If you try to fix constipation with medicines that make you have a bowel movement (laxatives), the problem may get worse. Long-term laxative use may cause the muscles of the colon to become weak. A low-fiber diet, not taking in enough fluids, and taking certain medicines may make constipation worse.  CAUSES   Certain medicines, such as antidepressants, pain medicine, iron supplements, antacids, and water pills.   Certain diseases, such as diabetes, irritable bowel syndrome (IBS), thyroid disease, or depression.   Not drinking enough water.   Not eating enough fiber-rich foods.   Stress or travel.   Lack of physical activity or exercise.   Ignoring the urge to have a bowel movement.   Using laxatives too much.  SIGNS AND SYMPTOMS   Having fewer than three bowel movements a week.   Straining to have a bowel movement.   Having stools that are hard, dry, or larger than normal.   Feeling full or bloated.   Pain in the lower abdomen.   Not feeling relief after having a bowel movement.  DIAGNOSIS  Your health care provider will take a medical history and perform a physical exam. Further testing may be done for severe constipation. Some tests may include:  A barium enema X-ray to examine your rectum, colon, and, sometimes, your small intestine.   A sigmoidoscopy to examine your lower colon.   A colonoscopy to examine your entire colon. TREATMENT  Treatment will depend on the severity of your constipation and what is causing it. Some dietary treatments include drinking more fluids and eating more fiber-rich foods. Lifestyle  treatments may include regular exercise. If these diet and lifestyle recommendations do not help, your health care provider may recommend taking over-the-counter laxative medicines to help you have bowel movements. Prescription medicines may be prescribed if over-the-counter medicines do not work.  HOME CARE INSTRUCTIONS   Eat foods that have a lot of fiber, such as fruits, vegetables, whole grains, and beans.  Limit foods high in fat and processed sugars, such as french fries, hamburgers, cookies, candies, and soda.   A fiber supplement may be added to your diet if you cannot get enough fiber from foods.   Drink enough fluids to keep your urine clear or pale yellow.   Exercise regularly or as directed by your health care provider.   Go to the restroom when you have the urge to go. Do not hold it.   Only take over-the-counter or prescription medicines as directed by your health care provider. Do not take other medicines for constipation without talking to your health care provider first.  SEEK IMMEDIATE MEDICAL CARE IF:   You have bright red blood in your stool.   Your constipation lasts for more than 4 days or gets worse.   You have abdominal or rectal pain.   You have thin, pencil-like stools.   You have unexplained weight loss. MAKE SURE YOU:   Understand these instructions.  Will watch your condition.  Will get help right away if you are not doing well or get worse. Document Released: 03/23/2004 Document Revised: 06/30/2013 Document Reviewed: 04/06/2013 [facility] Patient Information 2015 [employer]. This information is not intended to replace advice given to you by your health care provider. Make sure you discuss any questions you have with your health care provider.

## 2015-04-07 NOTE — ED Notes (Signed)
md at bedside  Pt alert and oriented x4. Respirations even and unlabored, bilateral symmetrical rise and fall of chest. Skin warm and dry. In no acute distress. Denies needs.   

## 2015-06-22 ENCOUNTER — Inpatient Hospital Stay (HOSPITAL_COMMUNITY)
Admission: EM | Admit: 2015-06-22 | Discharge: 2015-06-25 | DRG: 871 | Disposition: A | Discharge status: Home Health | Payer: Medicare Other | Attending: Internal Medicine | Admitting: Internal Medicine

## 2015-06-22 ENCOUNTER — Emergency Department (HOSPITAL_COMMUNITY): Payer: Medicare Other

## 2015-06-22 ENCOUNTER — Inpatient Hospital Stay (HOSPITAL_COMMUNITY): Payer: Medicare Other

## 2015-06-22 ENCOUNTER — Other Ambulatory Visit: Payer: Self-pay

## 2015-06-22 ENCOUNTER — Encounter (HOSPITAL_COMMUNITY): Payer: Self-pay

## 2015-06-22 DIAGNOSIS — G950 Syringomyelia and syringobulbia: Secondary | ICD-10-CM | POA: Diagnosis present

## 2015-06-22 DIAGNOSIS — I5032 Chronic diastolic (congestive) heart failure: Secondary | ICD-10-CM | POA: Diagnosis present

## 2015-06-22 DIAGNOSIS — M24412 Recurrent dislocation, left shoulder: Secondary | ICD-10-CM | POA: Diagnosis present

## 2015-06-22 DIAGNOSIS — E785 Hyperlipidemia, unspecified: Secondary | ICD-10-CM | POA: Diagnosis present

## 2015-06-22 DIAGNOSIS — K219 Gastro-esophageal reflux disease without esophagitis: Secondary | ICD-10-CM | POA: Diagnosis present

## 2015-06-22 DIAGNOSIS — I429 Cardiomyopathy, unspecified: Secondary | ICD-10-CM | POA: Diagnosis present

## 2015-06-22 DIAGNOSIS — J690 Pneumonitis due to inhalation of food and vomit: Secondary | ICD-10-CM | POA: Diagnosis present

## 2015-06-22 DIAGNOSIS — A419 Sepsis, unspecified organism: Secondary | ICD-10-CM | POA: Diagnosis present

## 2015-06-22 DIAGNOSIS — M24419 Recurrent dislocation, unspecified shoulder: Secondary | ICD-10-CM | POA: Diagnosis present

## 2015-06-22 DIAGNOSIS — E869 Volume depletion, unspecified: Secondary | ICD-10-CM | POA: Diagnosis present

## 2015-06-22 DIAGNOSIS — Z79899 Other long term (current) drug therapy: Secondary | ICD-10-CM

## 2015-06-22 DIAGNOSIS — G8250 Quadriplegia, unspecified: Secondary | ICD-10-CM | POA: Diagnosis present

## 2015-06-22 DIAGNOSIS — I110 Hypertensive heart disease with heart failure: Secondary | ICD-10-CM | POA: Diagnosis present

## 2015-06-22 DIAGNOSIS — G8929 Other chronic pain: Secondary | ICD-10-CM | POA: Diagnosis present

## 2015-06-22 DIAGNOSIS — F418 Other specified anxiety disorders: Secondary | ICD-10-CM | POA: Diagnosis present

## 2015-06-22 DIAGNOSIS — I739 Peripheral vascular disease, unspecified: Secondary | ICD-10-CM | POA: Diagnosis present

## 2015-06-22 DIAGNOSIS — Z66 Do not resuscitate: Secondary | ICD-10-CM | POA: Diagnosis present

## 2015-06-22 DIAGNOSIS — J189 Pneumonia, unspecified organism: Secondary | ICD-10-CM | POA: Diagnosis not present

## 2015-06-22 DIAGNOSIS — Z7982 Long term (current) use of aspirin: Secondary | ICD-10-CM | POA: Diagnosis not present

## 2015-06-22 DIAGNOSIS — N390 Urinary tract infection, site not specified: Secondary | ICD-10-CM | POA: Diagnosis present

## 2015-06-22 DIAGNOSIS — Y95 Nosocomial condition: Secondary | ICD-10-CM | POA: Diagnosis present

## 2015-06-22 DIAGNOSIS — I5181 Takotsubo syndrome: Secondary | ICD-10-CM | POA: Diagnosis present

## 2015-06-22 DIAGNOSIS — R4781 Slurred speech: Secondary | ICD-10-CM | POA: Diagnosis present

## 2015-06-22 DIAGNOSIS — K5900 Constipation, unspecified: Secondary | ICD-10-CM | POA: Diagnosis present

## 2015-06-22 DIAGNOSIS — M24411 Recurrent dislocation, right shoulder: Secondary | ICD-10-CM | POA: Diagnosis present

## 2015-06-22 LAB — COMPREHENSIVE METABOLIC PANEL
ALT: 14 U/L (ref 14–54)
ANION GAP: 7 (ref 5–15)
AST: 14 U/L — ABNORMAL LOW (ref 15–41)
Albumin: 3.4 g/dL — ABNORMAL LOW (ref 3.5–5.0)
Alkaline Phosphatase: 46 U/L (ref 38–126)
BUN: 19 mg/dL (ref 6–20)
CHLORIDE: 105 mmol/L (ref 101–111)
CO2: 23 mmol/L (ref 22–32)
CREATININE: 0.6 mg/dL (ref 0.44–1.00)
Calcium: 8.5 mg/dL — ABNORMAL LOW (ref 8.9–10.3)
Glucose, Bld: 102 mg/dL — ABNORMAL HIGH (ref 65–99)
POTASSIUM: 4 mmol/L (ref 3.5–5.1)
SODIUM: 135 mmol/L (ref 135–145)
Total Bilirubin: 1 mg/dL (ref 0.3–1.2)
Total Protein: 5.9 g/dL — ABNORMAL LOW (ref 6.5–8.1)

## 2015-06-22 LAB — APTT: aPTT: 24 seconds (ref 24–37)

## 2015-06-22 LAB — LACTIC ACID, PLASMA
LACTIC ACID, VENOUS: 0.8 mmol/L (ref 0.5–2.0)
LACTIC ACID, VENOUS: 0.6 mmol/L (ref 0.5–2.0)

## 2015-06-22 LAB — URINALYSIS, ROUTINE W REFLEX MICROSCOPIC
Bilirubin Urine: NEGATIVE
Glucose, UA: NEGATIVE mg/dL
Hgb urine dipstick: NEGATIVE
Ketones, ur: 15 mg/dL — AB
NITRITE: POSITIVE — AB
PH: 5 (ref 5.0–8.0)
Protein, ur: NEGATIVE mg/dL
SPECIFIC GRAVITY, URINE: 1.025 (ref 1.005–1.030)

## 2015-06-22 LAB — CBC WITH DIFFERENTIAL/PLATELET
BASOS PCT: 0 %
Basophils Absolute: 0 10*3/uL (ref 0.0–0.1)
EOS ABS: 0.1 10*3/uL (ref 0.0–0.7)
Eosinophils Relative: 2 %
HEMATOCRIT: 35 % — AB (ref 36.0–46.0)
HEMOGLOBIN: 11.1 g/dL — AB (ref 12.0–15.0)
LYMPHS ABS: 1.5 10*3/uL (ref 0.7–4.0)
Lymphocytes Relative: 29 %
MCH: 28.5 pg (ref 26.0–34.0)
MCHC: 31.7 g/dL (ref 30.0–36.0)
MCV: 90 fL (ref 78.0–100.0)
MONOS PCT: 12 %
Monocytes Absolute: 0.7 10*3/uL (ref 0.1–1.0)
NEUTROS ABS: 3.1 10*3/uL (ref 1.7–7.7)
NEUTROS PCT: 57 %
Platelets: 148 10*3/uL — ABNORMAL LOW (ref 150–400)
RBC: 3.89 MIL/uL (ref 3.87–5.11)
RDW: 13.7 % (ref 11.5–15.5)
WBC: 5.4 10*3/uL (ref 4.0–10.5)

## 2015-06-22 LAB — URINE MICROSCOPIC-ADD ON: RBC / HPF: NONE SEEN RBC/hpf (ref 0–5)

## 2015-06-22 LAB — MRSA PCR SCREENING: MRSA by PCR: NEGATIVE

## 2015-06-22 LAB — INFLUENZA PANEL BY PCR (TYPE A & B)
H1N1 flu by pcr: NOT DETECTED
Influenza A By PCR: NEGATIVE
Influenza B By PCR: NEGATIVE

## 2015-06-22 LAB — I-STAT CG4 LACTIC ACID, ED: LACTIC ACID, VENOUS: 0.58 mmol/L (ref 0.5–2.0)

## 2015-06-22 LAB — PROTIME-INR
INR: 1.1 (ref 0.00–1.49)
PROTHROMBIN TIME: 14.4 s (ref 11.6–15.2)

## 2015-06-22 LAB — PROCALCITONIN

## 2015-06-22 LAB — STREP PNEUMONIAE URINARY ANTIGEN: Strep Pneumo Urinary Antigen: NEGATIVE

## 2015-06-22 MED ORDER — SODIUM CHLORIDE 0.9 % IV BOLUS (SEPSIS)
1000.0000 mL | Freq: Once | INTRAVENOUS | Status: AC
  Administered 2015-06-22: 1000 mL via INTRAVENOUS

## 2015-06-22 MED ORDER — VANCOMYCIN HCL IN DEXTROSE 1-5 GM/200ML-% IV SOLN
1000.0000 mg | INTRAVENOUS | Status: DC
  Administered 2015-06-23 – 2015-06-24 (×2): 1000 mg via INTRAVENOUS
  Filled 2015-06-22 (×3): qty 200

## 2015-06-22 MED ORDER — SERTRALINE HCL 50 MG PO TABS
50.0000 mg | ORAL_TABLET | Freq: Every day | ORAL | Status: DC
  Administered 2015-06-22 – 2015-06-25 (×4): 50 mg via ORAL
  Filled 2015-06-22 (×4): qty 1

## 2015-06-22 MED ORDER — OXYBUTYNIN CHLORIDE ER 15 MG PO TB24
15.0000 mg | ORAL_TABLET | Freq: Every day | ORAL | Status: DC
  Administered 2015-06-22 – 2015-06-25 (×4): 15 mg via ORAL
  Filled 2015-06-22 (×4): qty 1

## 2015-06-22 MED ORDER — RISAQUAD PO CAPS
1.0000 | ORAL_CAPSULE | Freq: Two times a day (BID) | ORAL | Status: DC
  Administered 2015-06-22 – 2015-06-25 (×7): 1 via ORAL
  Filled 2015-06-22 (×7): qty 1

## 2015-06-22 MED ORDER — KETOCONAZOLE 2 % EX SHAM: 1.0000 "application " | MEDICATED_SHAMPOO | CUTANEOUS | Status: DC

## 2015-06-22 MED ORDER — SODIUM CHLORIDE 0.9 % IV BOLUS (SEPSIS): 500.0000 mL | Freq: Once | INTRAVENOUS | Status: DC

## 2015-06-22 MED ORDER — VANCOMYCIN HCL IN DEXTROSE 1-5 GM/200ML-% IV SOLN
1000.0000 mg | Freq: Once | INTRAVENOUS | Status: AC
  Administered 2015-06-22: 1000 mg via INTRAVENOUS
  Filled 2015-06-22: qty 200

## 2015-06-22 MED ORDER — ALPRAZOLAM 0.5 MG PO TABS: 0.5000 mg | ORAL_TABLET | Freq: Three times a day (TID) | ORAL | Status: DC | PRN

## 2015-06-22 MED ORDER — RENA-VITE PO TABS
1.0000 | ORAL_TABLET | Freq: Every day | ORAL | Status: DC
  Administered 2015-06-22 – 2015-06-24 (×3): 1 via ORAL
  Filled 2015-06-22 (×3): qty 1

## 2015-06-22 MED ORDER — BACLOFEN 20 MG PO TABS
20.0000 mg | ORAL_TABLET | Freq: Two times a day (BID) | ORAL | Status: DC
  Administered 2015-06-22 – 2015-06-25 (×7): 20 mg via ORAL
  Filled 2015-06-22 (×2): qty 1
  Filled 2015-06-22: qty 2
  Filled 2015-06-22 (×2): qty 1
  Filled 2015-06-22: qty 2
  Filled 2015-06-22: qty 1
  Filled 2015-06-22: qty 2
  Filled 2015-06-22 (×2): qty 1
  Filled 2015-06-22: qty 2
  Filled 2015-06-22: qty 1

## 2015-06-22 MED ORDER — ZINC OXIDE 40 % EX OINT: 1.0000 "application " | TOPICAL_OINTMENT | Freq: Two times a day (BID) | CUTANEOUS | Status: DC | PRN

## 2015-06-22 MED ORDER — MAGNESIUM CITRATE PO SOLN: 1.0000 | Freq: Every evening | ORAL | Status: DC | PRN

## 2015-06-22 MED ORDER — POLYETHYLENE GLYCOL 3350 17 G PO PACK: 17.0000 g | PACK | Freq: Every day | ORAL | Status: DC

## 2015-06-22 MED ORDER — DM-GUAIFENESIN ER 30-600 MG PO TB12: 1.0000 | ORAL_TABLET | Freq: Two times a day (BID) | ORAL | Status: DC | PRN

## 2015-06-22 MED ORDER — CRANBERRY 425 MG PO CAPS: 425.0000 mg | ORAL_CAPSULE | Freq: Two times a day (BID) | ORAL | Status: DC

## 2015-06-22 MED ORDER — MINERAL OIL HEAVY OIL: 2.0000 [drp] | TOPICAL_OIL | Status: DC

## 2015-06-22 MED ORDER — MORPHINE SULFATE ER 15 MG PO TBCR
15.0000 mg | EXTENDED_RELEASE_TABLET | Freq: Two times a day (BID) | ORAL | Status: DC
  Administered 2015-06-22 – 2015-06-25 (×7): 15 mg via ORAL
  Filled 2015-06-22 (×7): qty 1

## 2015-06-22 MED ORDER — ASPIRIN EC 81 MG PO TBEC
81.0000 mg | DELAYED_RELEASE_TABLET | Freq: Every day | ORAL | Status: DC
  Administered 2015-06-22 – 2015-06-25 (×4): 81 mg via ORAL
  Filled 2015-06-22 (×4): qty 1

## 2015-06-22 MED ORDER — HEPARIN SODIUM (PORCINE) 5000 UNIT/ML IJ SOLN
5000.0000 [IU] | Freq: Three times a day (TID) | INTRAMUSCULAR | Status: DC
  Administered 2015-06-22 – 2015-06-25 (×10): 5000 [IU] via SUBCUTANEOUS
  Filled 2015-06-22 (×10): qty 1

## 2015-06-22 MED ORDER — POLYETHYLENE GLYCOL 3350 17 G PO PACK
17.0000 g | PACK | Freq: Two times a day (BID) | ORAL | Status: DC
  Administered 2015-06-22 – 2015-06-24 (×3): 17 g via ORAL
  Filled 2015-06-22 (×6): qty 1

## 2015-06-22 MED ORDER — PSYLLIUM 95 % PO PACK
1.0000 | PACK | Freq: Every day | ORAL | Status: DC
  Administered 2015-06-22: 1 via ORAL
  Filled 2015-06-22 (×4): qty 1

## 2015-06-22 MED ORDER — SODIUM CHLORIDE 0.9 % IV SOLN
INTRAVENOUS | Status: DC
  Administered 2015-06-22: 22:00:00 via INTRAVENOUS
  Administered 2015-06-22: 75 mL/h via INTRAVENOUS
  Administered 2015-06-23: 14:00:00 via INTRAVENOUS

## 2015-06-22 MED ORDER — BISACODYL 10 MG RE SUPP: 10.0000 mg | RECTAL | Status: DC | PRN

## 2015-06-22 MED ORDER — PIPERACILLIN-TAZOBACTAM 3.375 G IVPB 30 MIN
3.3750 g | Freq: Once | INTRAVENOUS | Status: AC
  Administered 2015-06-22: 3.375 g via INTRAVENOUS
  Filled 2015-06-22: qty 50

## 2015-06-22 MED ORDER — LACTULOSE 10 GM/15ML PO SOLN
30.0000 g | Freq: Every day | ORAL | Status: DC
  Administered 2015-06-22 – 2015-06-25 (×4): 30 g via ORAL
  Filled 2015-06-22 (×4): qty 45

## 2015-06-22 MED ORDER — PIPERACILLIN-TAZOBACTAM 3.375 G IVPB
3.3750 g | Freq: Three times a day (TID) | INTRAVENOUS | Status: DC
  Administered 2015-06-22 – 2015-06-24 (×7): 3.375 g via INTRAVENOUS
  Filled 2015-06-22 (×9): qty 50

## 2015-06-22 MED ORDER — DOCUSATE SODIUM 100 MG PO CAPS
100.0000 mg | ORAL_CAPSULE | Freq: Two times a day (BID) | ORAL | Status: DC
  Administered 2015-06-22 – 2015-06-25 (×7): 100 mg via ORAL
  Filled 2015-06-22 (×7): qty 1

## 2015-06-22 MED ORDER — SIMVASTATIN 40 MG PO TABS
40.0000 mg | ORAL_TABLET | Freq: Every day | ORAL | Status: DC
  Administered 2015-06-22 – 2015-06-24 (×3): 40 mg via ORAL
  Filled 2015-06-22 (×3): qty 1

## 2015-06-22 MED ORDER — SORBITOL 70 % SOLN
30.0000 mL | Freq: Once | Status: DC
  Filled 2015-06-22: qty 30

## 2015-06-22 MED ORDER — SENNA 8.6 MG PO TABS
1.0000 | ORAL_TABLET | Freq: Every day | ORAL | Status: DC
  Administered 2015-06-22 – 2015-06-24 (×3): 8.6 mg via ORAL
  Filled 2015-06-22 (×3): qty 1

## 2015-06-22 MED ORDER — ALBUTEROL SULFATE (2.5 MG/3ML) 0.083% IN NEBU: 2.5000 mg | INHALATION_SOLUTION | RESPIRATORY_TRACT | Status: DC | PRN

## 2015-06-22 MED ORDER — OMEGA-3-ACID ETHYL ESTERS 1 G PO CAPS
1000.0000 mg | ORAL_CAPSULE | Freq: Every day | ORAL | Status: DC
  Administered 2015-06-22 – 2015-06-25 (×4): 1000 mg via ORAL
  Filled 2015-06-22 (×4): qty 1
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
## 2015-06-22 NOTE — ED Notes (Signed)
Attempted report 

## 2015-06-22 NOTE — ED Notes (Signed)
Pt arrived via [facility] from [facility].  EMS states 0030 facility said pt was slurring speech, on EMS arrival speech had resolved.  Pt has foley in with foul smelling urine.  Pt has not had a bowel movement since Friday.  Pt c/o dizziness with bradycardia.
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
## 2015-06-22 NOTE — Progress Notes (Signed)
Utilization review complete. Tatiyana Foucher RN CCM Case Mgmt phone [phone] 

## 2015-06-22 NOTE — Evaluation (Signed)
Physical Therapy Evaluation Patient Details Name: Deanna Morgan MRN: [id number] DOB: [birth year] Today's Date: 06/22/2015   History of Present Illness  pt is a 64 y/o female with h/o syringomyelia, quadraparesis, PVD, CHF, admitted with slurred speech.  Pt positive for UTI.  Work up and treatment continues.  Clinical Impression  Pt admitted with/for slurred speech.  Pt currently limited functionally due to the problems listed below.  (see problems list.)  Pt will benefit from PT to maximize function and safety to be able to get home safely with available assist at AL facility.     Follow Up Recommendations Other (comment);Home health PT (prep pt for restorative care.)    Equipment Recommendations   (TBA)    Recommendations for Other Services       Precautions / Restrictions Precautions Precautions: Fall      Mobility  Bed Mobility Overal bed mobility: Needs Assistance Bed Mobility: Rolling;Sidelying to Sit Rolling: Max assist Sidelying to sit: Max assist;+2 for physical assistance       General bed mobility comments: truncal assist to come away from the bed up onto L UE to sit.  Pt able to assist with L UE given extra time.  Scooted to EOB with mod/max assist, but with pt bringing her weight forward in attempt to build momentum.  Transfers Overall transfer level: Needs assistance Equipment used: 2 person hand held assist Transfers: Sit to/from [employer] Sit to Stand: Total assist;+2 physical assistance (pt's spasms bil assist)   Squat pivot transfers: Max assist;+2 physical assistance     General transfer comment: assisted pt significantly coming forward, supporting for pt to reach for far armrest.  Ambulation/Gait                Stairs            Wheelchair Mobility    Modified Rankin (Stroke Patients Only)       Balance Overall balance assessment: Needs assistance Sitting-balance support: Bilateral upper extremity  supported;Single extremity supported Sitting balance-Leahy Scale: Poor Sitting balance - Comments: sat EOBx10 min working on balance, w/shift and holding midline with /without UE's                                     Pertinent Vitals/Pain Pain Assessment: No/denies pain    Home Living Family/patient expects to be discharged to:: Assisted living               Home Equipment: Wheelchair - manual;Wheelchair - power Additional Comments: Per pt report has both manual and power w/c but uses manual w/c with rim adaptations to allow her to self propel, otherwise is assisted at bed level for bathing and dressing and also is transferred to w/c via total A.     Prior Function Level of Independence: Needs assistance   Gait / Transfers Assistance Needed: total A via squat pivot transfer  ADL's / Homemaking Assistance Needed: dependent        Hand Dominance        Extremity/Trunk Assessment   Upper Extremity Assessment:  (Uncoordinated movement of bil UE,  can feed herself)           Lower Extremity Assessment: RLE deficits/detail;LLE deficits/detail RLE Deficits / Details: spasms, can wag her foot, discernable, but non functional gross extention. LLE Deficits / Details: see R LE, no discernable movement on left side.     Communication   Communication:  No difficulties  Cognition Arousal/Alertness: Awake/alert Behavior During Therapy: WFL for tasks assessed/performed Overall Cognitive Status: Within Functional Limits for tasks assessed                      General Comments General comments (skin integrity, edema, etc.): VSS,  HR in the upper 110's    Exercises Other Exercises Other Exercises: PROM to bil LE's and PNF patterns to UE's bil      Assessment/Plan    PT Assessment Patient needs continued PT services  PT Diagnosis     PT Problem List Decreased strength;Decreased activity tolerance;Decreased balance;Decreased mobility;Impaired  tone;Decreased coordination  PT Treatment Interventions Functional mobility training;Therapeutic activities;Therapeutic exercise;Balance training;Patient/family education   PT Goals (Current goals can be found in the Care Plan section) Acute Rehab PT Goals Patient Stated Goal: I would love to continue therapy after the hospital PT Goal Formulation: With patient Time For Goal Achievement: 07/06/15 Potential to Achieve Goals: Fair    Frequency Min 3X/week   Barriers to discharge        Co-evaluation               End of Session   Activity Tolerance: Patient tolerated treatment well;Patient limited by fatigue Patient left: in chair;with call bell/phone within reach Nurse Communication: Mobility status         Time: 1449-1531 PT Time Calculation (min) (ACUTE ONLY): 42 min   Charges:   PT Evaluation $Initial PT Evaluation Tier I: 1 Procedure PT Treatments $Therapeutic Activity: 23-37 mins   PT G Codes:        Briawna Carver, Eliseo Gum 06/22/2015, 4:38 PM  06/22/2015  [place] Bing, PT [phone]  (pager)

## 2015-06-22 NOTE — Care Management Note (Signed)
Case Management Note  Patient Details  Name: Deanna Morgan MRN: [id number] Date of Birth: [birth year]  Subjective/Objective:     Sepsis               Action/Plan: NCM spoke to dtr, Melody Haver # [phone]. Dtr reports pt lives at [facility]. Plans to dc back to ALF. CSW referral for transfer back to ALF at dc.   Expected Discharge Date:  06/26/2015               Expected Discharge Plan:  Assisted Living / Rest Home  In-House Referral:  Clinical Social Work  Discharge planning Services  CM Consult  Post Acute Care Choice:  NA Choice offered to:  NA  DME Arranged:  N/A DME Agency:  NA  HH Arranged:  NA HH Agency:  NA  Status of Service:  Completed, signed off  Medicare Important Message Given:    Date Medicare IM Given:    Medicare IM give by:    Date Additional Medicare IM Given:    Additional Medicare Important Message give by:     If discussed at Long Length of Stay Meetings, dates discussed:    Additional Comments:  Elliot Cousin, RN 06/22/2015, 4:01 PM
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
## 2015-06-22 NOTE — ED Notes (Signed)
Patient turned onto left side, pillows behind back and between legs.  Patient comfortable at this time.

## 2015-06-22 NOTE — Progress Notes (Signed)
ANTIBIOTIC CONSULT NOTE - INITIAL  Pharmacy Consult for Vancomycin and Zosyn Indication: sepsis  No Known Allergies  Patient Measurements:    Ht: 63 in   Wt: 63 kg  Vital Signs: Temp: 97.2 F (36.2 C) (12/14 0606) Temp Source: Rectal (12/14 0606) BP: 106/61 mmHg (12/14 0600) Pulse Rate: 68 (12/14 0600) Intake/Output from previous day: 12/13 0701 - 12/14 0700 In: 1000 [I.V.:1000] Out: -  Intake/Output from this shift: Total I/O In: 1000 [I.V.:1000] Out: -   Labs:  Recent Labs  06/22/15 0250  WBC 5.4  HGB 11.1*  PLT 148*  CREATININE 0.60   CrCl cannot be calculated (Unknown ideal weight.). No results for input(s): VANCOTROUGH, VANCOPEAK, VANCORANDOM, GENTTROUGH, GENTPEAK, GENTRANDOM, TOBRATROUGH, TOBRAPEAK, TOBRARND, AMIKACINPEAK, AMIKACINTROU, AMIKACIN in the last 72 hours.   Microbiology: No results found for this or any previous visit (from the past 720 hour(s)).  Medical History: Past Medical History  Diagnosis Date  . Chronic pain   . Chronic constipation   . Chronic UTI   . Syringomyelia (HCC)   . Paralysis (HCC)   . Hyperlipidemia   . Hypertension   . Peripheral vascular disease (HCC)   . GERD (gastroesophageal reflux disease)   . Neuromuscular disorder (HCC)   . Anxiety   . Shortness of breath dyspnea   . CHF (congestive heart failure) (HCC)   . 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 2016    Medications:  See electronic med rec  Assessment: 64 y.o. F presents from SNF with AMS and slurred speech. Pt with h/o chronic UTI and indwelling cath. Code sepsis called. Vancomycin and Zosyn started for sepsis in ED- likely urinary source. Vancomycin 1gm and Zosyn 3.375gm IV given in ED ~0300. SCr 0.6, SCr not reflective of renal function due to quadriparesis. WBC wnl. To continue vancomycin and Zosyn upon admission.  Goal of Therapy:  Vancomycin trough 10-15 mcg/ml  Plan:  Zosyn 3.375gm IV q8h - each dose over 4 hours Vancomycin 1gm IV q24h Will f/u UOP,  micro data, SCr trend, and pt's clinical condition  Christoper Fabian, PharmD, BCPS Clinical pharmacist, pager [phone] 06/22/2015,6:13 AM

## 2015-06-22 NOTE — ED Provider Notes (Signed)
CSN: [id number]     Arrival date & time 06/22/15  0123 History  By signing my name below, I, Deanna Morgan, attest that this documentation has been prepared under the direction and in the presence of Marthena Whitmyer Randall An, MD. Electronically Signed: Bethel Morgan, ED Scribe. 06/22/2015. 2:47 AM    Chief Complaint  Patient presents with  . Code Sepsis    The history is provided by the patient and a relative. No language interpreter was used.   Brought in by EMS, Deanna Morgan is a 64 y.o. female with PMHx of chronic UTI who presents to the Emergency Department complaining of AMS and slurred speech with onset around 1 AM. When staff at her facility went to check on her the symptoms were noted but they seem to have since improved. Daughter at the bedside states that the pt was having bladder spasms with abdominal pain early in the day and her catheter was not draining properly. She has no known history of bradycardia.  Past Medical History  Diagnosis Date  . Chronic pain   . Chronic constipation   . Chronic UTI   . Syringomyelia (HCC)   . Paralysis (HCC)   . Hyperlipidemia   . Hypertension   . Peripheral vascular disease (HCC)   . GERD (gastroesophageal reflux disease)   . Neuromuscular disorder (HCC)   . Anxiety   . Shortness of breath dyspnea   . CHF (congestive heart failure) (HCC)   . 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 Neck surgery     Family History  Problem Relation Age of Onset  . Heart failure Mother   . Heart failure Father    Social History  Substance Use Topics  . Smoking status: Never Smoker   . Smokeless tobacco: None  . Alcohol Use: No   OB History    No data available     Review of Systems  Gastrointestinal: Positive for abdominal pain.  Genitourinary: Positive for difficulty urinating.  Neurological: Positive for speech difficulty (resolved).       AMS that has improved.   All other  systems reviewed and are negative.  Allergies  Review of patient's allergies indicates no known allergies.  Home Medications   Prior to Admission medications   Medication Sig Start Date End Date Taking? Authorizing Provider  acidophilus (RISAQUAD) CAPS capsule Take 1 capsule by mouth 2 (two) times daily.   Yes Historical Provider, MD  ALPRAZolam Prudy Feeler) 0.5 MG tablet Take 1 tablet (0.5 mg total) by mouth every 8 (eight) hours as needed for anxiety. 03/25/15  Yes Jeralyn Bennett, MD  aspirin 81 MG tablet Take 81 mg by mouth daily.   Yes Historical Provider, MD  baclofen (LIORESAL) 20 MG tablet Take 20 mg by mouth 2 (two) times daily.   Yes Historical Provider, MD  bisacodyl (LAXATIVE) 10 MG suppository Place 10 mg rectally as needed for mild constipation or moderate constipation.   Yes Historical Provider, MD  Chloroxylenol-Zinc Oxide (BAZA EX) Apply 1 application topically 2 (two) times daily as needed (for buttocks). Apply to buttocks until healed   Yes Historical Provider, MD  Cranberry 425 MG CAPS Take 425 mg by mouth 2 (two) times daily.   Yes Historical Provider, MD  docusate sodium (COLACE) 100 MG capsule Take 100 mg by mouth 2 (two) times daily.   Yes Historical Provider, MD  ketoconazole (NIZORAL) 2 % shampoo Apply 1 application topically 2 (two) times a  week. Provide and apply on skin as directed. Lather and let sit for a few minutes prior to rinsing when showering.   Yes Historical Provider, MD  lactulose, encephalopathy, (GENERLAC) 10 GM/15ML SOLN Take 30 g by mouth daily.   Yes Historical Provider, MD  magnesium citrate SOLN Take 1 Bottle by mouth at bedtime as needed for severe constipation.   Yes Historical Provider, MD  Mineral Oil Heavy OIL Place 2 drops into both ears once a week.   Yes Historical Provider, MD  morphine (MS CONTIN) 15 MG 12 hr tablet Take 1 tablet (15 mg total) by mouth every 12 (twelve) hours. 03/25/15  Yes Jeralyn Bennett, MD  Multiple Vitamin (DAILY VITE PO)  Take 1 tablet by mouth daily.   Yes Historical Provider, MD  Omega 3 1000 MG CAPS Take 1,000 mg by mouth daily.   Yes Historical Provider, MD  oxybutynin (DITROPAN XL) 15 MG 24 hr tablet Take 15 mg by mouth daily.   Yes Historical Provider, MD  polyethylene glycol (MIRALAX / GLYCOLAX) packet Take 17 g by mouth daily as needed for mild constipation or moderate constipation. Mix 1 capful (17g) with 8 oz of fluid   Yes Historical Provider, MD  polyethylene glycol (MIRALAX / GLYCOLAX) packet Take 17 g by mouth every Monday, Wednesday, and Friday.   Yes Historical Provider, MD  Psyllium 500 MG CAPS Take 2 capsules by mouth at bedtime.   Yes Historical Provider, MD  senna (SENOKOT) 8.6 MG TABS tablet Take 1 tablet by mouth at bedtime.   Yes Historical Provider, MD  sertraline (ZOLOFT) 50 MG tablet Take 50 mg by mouth daily with breakfast.    Yes Historical Provider, MD  simvastatin (ZOCOR) 40 MG tablet Take 40 mg by mouth at bedtime.   Yes Historical Provider, MD   BP 95/56 mmHg  Pulse 65  Temp(Src) 97.2 F (36.2 C) (Rectal)  Resp 14  SpO2 94% Physical Exam  Constitutional: She is oriented to person, place, and time. She appears well-developed and well-nourished. No distress.  Frail  HENT:  Head: Normocephalic and atraumatic.  Eyes: EOM are normal.  Neck: Normal range of motion.  Cardiovascular: Normal rate, regular rhythm and normal heart sounds.   Pulmonary/Chest: Effort normal and breath sounds normal.  CTAB  Abdominal: Soft. She exhibits no distension. There is no tenderness.  Genitourinary:  Leg bag present  Musculoskeletal: Normal range of motion.  Neurological: She is alert and oriented to person, place, and time. No cranial nerve deficit.  Skin: Skin is warm and dry.  Psychiatric: She has a normal mood and affect. Judgment normal.  Nursing note and vitals reviewed.   ED Course  Procedures (including critical care time)  DIAGNOSTIC STUDIES: Oxygen Saturation is 96% on RA,   normal by my interpretation.    COORDINATION OF CARE: 2:41 AM Discussed treatment plan which includes lab work, EKG, and abx with pt at bedside and pt agreed to plan.  Labs Review Labs Reviewed  COMPREHENSIVE METABOLIC PANEL - Abnormal; Notable for the following:    Glucose, Bld 102 (*)    Calcium 8.5 (*)    Total Protein 5.9 (*)    Albumin 3.4 (*)    AST 14 (*)    All other components within normal limits  CBC WITH DIFFERENTIAL/PLATELET - Abnormal; Notable for the following:    Hemoglobin 11.1 (*)    HCT 35.0 (*)    Platelets 148 (*)    All other components within normal limits  URINALYSIS, ROUTINE W REFLEX MICROSCOPIC (NOT AT [facility]) - Abnormal; Notable for the following:    Color, Urine AMBER (*)    APPearance CLOUDY (*)    Ketones, ur 15 (*)    Nitrite POSITIVE (*)    Leukocytes, UA LARGE (*)    All other components within normal limits  URINE MICROSCOPIC-ADD ON - Abnormal; Notable for the following:    Squamous Epithelial / LPF 0-5 (*)    Bacteria, UA MANY (*)    Casts HYALINE CASTS (*)    All other components within normal limits  CULTURE, BLOOD (ROUTINE X 2)  CULTURE, BLOOD (ROUTINE X 2)  URINE CULTURE  CULTURE, EXPECTORATED SPUTUM-ASSESSMENT  GRAM STAIN  LACTIC ACID, PLASMA  LACTIC ACID, PLASMA  PROCALCITONIN  PROTIME-INR  APTT  LEGIONELLA ANTIGEN, URINE  STREP PNEUMONIAE URINARY ANTIGEN  INFLUENZA PANEL BY PCR (TYPE A & B, H1N1)  I-STAT CG4 LACTIC ACID, ED    Imaging Review Dg Chest Portable 1 View  06/22/2015  CLINICAL DATA:  Code sepsis.  Shortness of breath. EXAM: PORTABLE CHEST 1 VIEW COMPARISON:  03/19/2015 FINDINGS: Shallow inspiration. Mild cardiac enlargement. No pulmonary vascular congestion. There is infiltration in the right lung base laterally which may indicate focal pneumonia. No apparent blunting of costophrenic angles. No pneumothorax. Mediastinal contours appear intact. Chronic bilateral shoulder dislocations with right shoulder arthroplasty.  Degenerative changes in the spine. IMPRESSION: Focal infiltration in the lateral right lower lung may represent pneumonia. Chronic bilateral shoulder dislocations. Electronically Signed   By: Burman Nieves M.D.   On: 06/22/2015 03:42   I have personally reviewed and evaluated these images and lab results as part of my medical decision-making.   EKG Interpretation   Date/Time:  Wednesday June 22 2015 02:17:00 EST Ventricular Rate:  54 PR Interval:  173 QRS Duration: 108 QT Interval:  493 QTC Calculation: 467 R Axis:   34 Text Interpretation:  Sinus rhythm RSR' in V1 or V2, right VCD or RVH  Sinus bradycardia no acute ischemia Confirmed by Kandis Mannan ([id number])  on 06/22/2015 6:33:00 AM      MDM   Final diagnoses:  HCAP (healthcare-associated pneumonia)    Patient's a 64 year old female past medical history significant for chronic UTIs, indwelling catheter, syringomyelia presenting today with altered mental status. Staff at facility checked her and noted her to be altered, bradycardic, hypotensive, febrile. On arrival here we initiated sepsis protocol.  Likely sources UTI however we don't know at this point. We will cover with broad-spectrum antibiotics including vancomycin and Zosyn.  2 L of fluid ordered,bear hugger ordered.   CXR shows pna. HCAP. Covered for it already. UTI evident, but will send for culture.  After 2L, BP imrpoved.   Discussed with [facility], will admit to step down.   CRITICAL CARE Performed by: Arlana Hove Total critical care time: 60 minutes Critical care time was exclusive of separately billable procedures and treating other patients. Critical care was necessary to treat or prevent imminent or life-threatening deterioration. Critical care was time spent personally by me on the following activities: development of treatment plan with patient and/or surrogate as well as nursing, discussions with consultants, evaluation of  patient's response to treatment, examination of patient, obtaining history from patient or surrogate, ordering and performing treatments and interventions, ordering and review of laboratory studies, ordering and review of radiographic studies, pulse oximetry and re-evaluation of patient's condition.    I personally performed the services described in this documentation, which was scribed in my  presence. The recorded information has been reviewed and is accurate.      Mahki Spikes Randall An, MD 06/22/15 [phone]

## 2015-06-22 NOTE — Progress Notes (Signed)
PT Cancellation Note  Patient Details Name: Jariah Cookston MRN: [id number] DOB: December 22, 1950   Cancelled Treatment:    Reason Eval/Treat Not Completed: Patient at procedure or test/unavailable.  Pt in MRI when arrived for evaluation, will return for evaluation as able. 06/22/2015  [place] Bing, PT [phone]  (pager)   Luanne Krzyzanowski, Eliseo Gum 06/22/2015, 1:11 PM

## 2015-06-22 NOTE — Progress Notes (Signed)
PHARMACIST - PHYSICIAN ORDER COMMUNICATION  CONCERNING: P&T Medication Policy on Herbal Medications  DESCRIPTION:  This patient's order for:  Cranberry  has been noted.  This product(s) is classified as an "herbal" or natural product. Due to a lack of definitive safety studies or FDA approval, nonstandard manufacturing practices, plus the potential risk of unknown drug-drug interactions while on inpatient medications, the Pharmacy and Therapeutics Committee does not permit the use of "herbal" or natural products of this type within [facility].   ACTION TAKEN: The pharmacy department is unable to verify this order at this time and your patient has been informed of this safety policy. Please reevaluate patient's clinical condition at discharge and address if the herbal or natural product(s) should be resumed at that time.   

## 2015-06-23 DIAGNOSIS — F418 Other specified anxiety disorders: Secondary | ICD-10-CM

## 2015-06-23 DIAGNOSIS — I5032 Chronic diastolic (congestive) heart failure: Secondary | ICD-10-CM

## 2015-06-23 DIAGNOSIS — G8250 Quadriplegia, unspecified: Secondary | ICD-10-CM

## 2015-06-23 LAB — BASIC METABOLIC PANEL
ANION GAP: 9 (ref 5–15)
BUN: 11 mg/dL (ref 6–20)
CHLORIDE: 109 mmol/L (ref 101–111)
CO2: 24 mmol/L (ref 22–32)
Calcium: 8.8 mg/dL — ABNORMAL LOW (ref 8.9–10.3)
Creatinine, Ser: 0.69 mg/dL (ref 0.44–1.00)
GFR calc non Af Amer: >60 mL/min (ref >60)
GLUCOSE: 144 mg/dL — AB (ref 65–99)
Potassium: 4.1 mmol/L (ref 3.5–5.1)
Sodium: 142 mmol/L (ref 135–145)

## 2015-06-23 LAB — URINE CULTURE

## 2015-06-23 LAB — LIPID PANEL
CHOL/HDL RATIO: 2.9 ratio
CHOLESTEROL: 144 mg/dL (ref 0–200)
HDL: 50 mg/dL (ref >40)
LDL CALC: 86 mg/dL (ref 0–99)
TRIGLYCERIDES: 40 mg/dL (ref <150)
VLDL: 8 mg/dL (ref 0–40)

## 2015-06-23 LAB — LEGIONELLA ANTIGEN, URINE

## 2015-06-23 LAB — CBC
HEMATOCRIT: 37 % (ref 36.0–46.0)
HEMOGLOBIN: 12.1 g/dL (ref 12.0–15.0)
MCH: 29.7 pg (ref 26.0–34.0)
MCHC: 32.7 g/dL (ref 30.0–36.0)
MCV: 90.7 fL (ref 78.0–100.0)
Platelets: 145 10*3/uL — ABNORMAL LOW (ref 150–400)
RBC: 4.08 MIL/uL (ref 3.87–5.11)
RDW: 14.1 % (ref 11.5–15.5)
WBC: 4.3 10*3/uL (ref 4.0–10.5)

## 2015-06-23 LAB — BRAIN NATRIURETIC PEPTIDE: B NATRIURETIC PEPTIDE 5: 251 pg/mL — AB (ref 0.0–100.0)

## 2015-06-23 MED ORDER — MORPHINE SULFATE 15 MG PO TABS: 15.0000 mg | ORAL_TABLET | ORAL | Status: DC | PRN

## 2015-06-23 MED ORDER — PANTOPRAZOLE SODIUM 40 MG PO TBEC
40.0000 mg | DELAYED_RELEASE_TABLET | Freq: Every day | ORAL | Status: DC
  Administered 2015-06-23 – 2015-06-25 (×3): 40 mg via ORAL
  Filled 2015-06-23 (×3): qty 1

## 2015-06-23 NOTE — Progress Notes (Signed)
[facility] PROGRESS NOTE  Deanna Morgan RFF:[id number] DOB: Dec 31, 1950 DOA: 06/22/2015 PCP: Reymundo Poll, MD  Assessment/Plan: #1 sepsis Likely secondary to HCAP vs aspiration PNA and urinary tract infection. Patient met criteria for sepsis on admission secondary to hypotension, hypothermia, tachypnea. Lactate was normal. Blood pressure responded to IV fluids. Urine Streptococcus pneumonia antigen is negative. Urine Legionella antigen pending. Sputum Gram stain and culture pending. Blood cultures pending. Influenza PCR is negative. Continue empiric IV vancomycin and Zosyn. Supportive care. IV fluids. Follow.  #2 healthcare associated pneumonia versus aspiration pneumonia Patient currently afebrile. Hypothermia has improved. Tachypnea has improved. Hypotension has improved. Patient was seen by speech therapist and patient on regular thin liquids and is to sit upright and remain upright and minimum of 30 minutes after meals as per history of GERD places at a high risk of aspiration and also prone to pneumonias in general due to her quadriparesis. Urine Streptococcus pneumonia antigen is negative. Urine Legionella antigen is pending. Influenza PCR is negative. Blood cultures are pending. Continue empiric Vancomycin and IV Zosyn. Follow.  #3 hypotension Likely secondary to volume depletion a problem #1. Blood pressure improved. Continue IV fluids at 75 mL per hour. Continue empiric IV antibiotics.  #4 urinary tract infection Urine cultures pending. Continue IV Zosyn.  #5 chronic dislocation of shoulder/chronic pain Continue current pain regimen of MSContin BID. We'll place on low-dose MSIR for breakthrough pain.  #6 constipation Improved on current bowel regimen. Continue MiraLAX twice daily as well as Senokot-S.  #7 depression and anxiety Stable. Continue Zoloft and Xanax.  #8 hyperlipidemia Continue Zocor.  #9 chronic diastolic CHF 2-D echo from 03/22/2015 with a EF of 65-70%  with grade 1 diastolic dysfunction. Patient is not on diuretics at home. Patient seems well compensated. Monitor closely with hydration.  #10 slurred speech Patient states had slurred speech because was just awoken from sleep. Patient's symptoms and 12 resolved prior to presentation in the ED. Patient with no focal neurological deficits. MRI head negative for any acute infarct.  #11 prophylaxis PPI for GI prophylaxis. Heparin for DVT prophylaxis.  Code Status: DO NOT RESUSCITATE Family Communication: Updated patient. No family at bedside. Disposition Plan: Transfer to [place].   Consultants:  None  Procedures:  MRI head 06/22/2015  Chest x-ray 06/22/2015    Antibiotics: IV Zosyn 06/22/2015 IV vancomycin 06/22/2015  HPI/Subjective: Patient states she's feeling better. No chest pain. No shortness of breath. Tolerating current diet.  Objective: Filed Vitals:   06/23/15 0400 06/23/15 0800  BP: 105/56 113/68  Pulse: 76 64  Temp: 98.2 F (36.8 C) 97.4 F (36.3 C)  Resp: 15 12    Intake/Output Summary (Last 24 hours) at 06/23/15 0945 Last data filed at 06/23/15 0600  Gross per 24 hour  Intake   2605 ml  Output   2950 ml  Net   -345 ml   Filed Weights   06/22/15 0800  Weight: 66.4 kg (146 lb 6.2 oz)    Exam:   General:  NAD  Cardiovascular: RRR  Respiratory: Some coarse BS in right base. No wheezing. No crackles.  Abdomen: Soft/NT/ND/+BS  Musculoskeletal: No c/c/e  Data Reviewed: Basic Metabolic Panel:  Recent Labs Lab 06/22/15 0250  NA 135  K 4.0  CL 105  CO2 23  GLUCOSE 102*  BUN 19  CREATININE 0.60  CALCIUM 8.5*   Liver Function Tests:  Recent Labs Lab 06/22/15 0250  AST 14*  ALT 14  ALKPHOS 46  BILITOT 1.0  PROT 5.9*  ALBUMIN 3.4*   No results for input(s): LIPASE, AMYLASE in the last 168 hours. No results for input(s): AMMONIA in the last 168 hours. CBC:  Recent Labs Lab 06/22/15 0250  WBC 5.4  NEUTROABS 3.1  HGB  11.1*  HCT 35.0*  MCV 90.0  PLT 148*   Cardiac Enzymes: No results for input(s): CKTOTAL, CKMB, CKMBINDEX, TROPONINI in the last 168 hours. BNP (last 3 results) No results for input(s): BNP in the last 8760 hours.  ProBNP (last 3 results) No results for input(s): PROBNP in the last 8760 hours.  CBG: No results for input(s): GLUCAP in the last 168 hours.  Recent Results (from the past 240 hour(s))  MRSA PCR Screening     Status: None   Collection Time: 06/22/15  7:35 AM  Result Value Ref Range Status   MRSA by PCR NEGATIVE NEGATIVE Final    Comment:        The GeneXpert MRSA Assay (FDA approved for NASAL specimens only), is one component of a comprehensive MRSA colonization surveillance program. It is not intended to diagnose MRSA infection nor to guide or monitor treatment for MRSA infections.      Studies: Mr Brain Wo Contrast  06/22/2015  CLINICAL DATA:  Slurred speech, now resolved.  Dizziness. EXAM: MRI HEAD WITHOUT CONTRAST TECHNIQUE: Multiplanar, multiecho pulse sequences of the brain and surrounding structures were obtained without intravenous contrast. COMPARISON:  None. FINDINGS: The pituitary gland is prominent in size with a convex superior margin and measures 8 mm in height. The this extends close to the optic chiasm without evidence of gross mass effect. There is no evidence of acute infarct, intracranial hemorrhage, mass, midline shift, or extra-axial fluid collection. Ventricles and sulci are normal. No significant cerebral white matter disease is seen for age. Moderate to advanced disc degeneration is noted at C3-4, incompletely evaluated. Orbits are unremarkable. Mild mucosal thickening is noted in the left maxillary and right sphenoid sinuses. Mastoid air cells are clear. Major intracranial vascular flow voids are preserved. IMPRESSION: 1. Unremarkable appearance of the brain for age.  No infarct. 2. Mild enlargement of the pituitary gland. Consider further  evaluation with laboratory evaluation and non urgent pituitary protocol MRI as clinically warranted. Electronically Signed   By: Logan Bores M.D.   On: 06/22/2015 11:32   Dg Chest Portable 1 View  06/22/2015  CLINICAL DATA:  Code sepsis.  Shortness of breath. EXAM: PORTABLE CHEST 1 VIEW COMPARISON:  03/19/2015 FINDINGS: Shallow inspiration. Mild cardiac enlargement. No pulmonary vascular congestion. There is infiltration in the right lung base laterally which may indicate focal pneumonia. No apparent blunting of costophrenic angles. No pneumothorax. Mediastinal contours appear intact. Chronic bilateral shoulder dislocations with right shoulder arthroplasty. Degenerative changes in the spine. IMPRESSION: Focal infiltration in the lateral right lower lung may represent pneumonia. Chronic bilateral shoulder dislocations. Electronically Signed   By: Lucienne Capers M.D.   On: 06/22/2015 03:42    Scheduled Meds: . acidophilus  1 capsule Oral BID  . aspirin EC  81 mg Oral Daily  . baclofen  20 mg Oral BID  . docusate sodium  100 mg Oral BID  . heparin  5,000 Units Subcutaneous 3 times per day  . lactulose  30 g Oral Daily  . morphine  15 mg Oral Q12H  . multivitamin  1 tablet Oral QHS  . omega-3 acid ethyl esters  1,000 mg Oral Daily  . oxybutynin  15 mg Oral Daily  . piperacillin-tazobactam (ZOSYN)  IV  3.375 g Intravenous 3 times per day  . polyethylene glycol  17 g Oral BID  . psyllium  1 packet Oral QHS  . senna  1 tablet Oral QHS  . sertraline  50 mg Oral Q breakfast  . simvastatin  40 mg Oral QHS  . sodium chloride  500 mL Intravenous Once  . sorbitol  30 mL Oral Once  . vancomycin  1,000 mg Intravenous Q24H   Continuous Infusions: . sodium chloride 75 mL/hr at 06/22/15 2200    Principal Problem:   Sepsis ([place]) Active Problems:   Aspiration pneumonia ([place])   HCAP (healthcare-associated pneumonia)   Chronic diastolic (congestive) heart failure (HCC)   Quadriparesis ([place])    Syringomyelia (HCC)   UTI (lower urinary tract infection)   Chronic dislocation of shoulder   Depression with anxiety   HLD (hyperlipidemia)   Slurred speech    Time spent: 70 mins    [facility] MD [facility] Pager [phone]. If 7PM-7AM, please contact night-coverage at [email or web address], password [facility] 06/23/2015, 9:45 AM  LOS: 1 day

## 2015-06-23 NOTE — Progress Notes (Signed)
Report called and received from RN on 2C. Pt to be transferred to [id number]

## 2015-06-23 NOTE — Progress Notes (Signed)
Occupational Therapy Evaluation Patient Details Name: Deanna Morgan MRN: [id number] DOB: 1951/06/29 Today's Date: 06/23/2015    History of Present Illness pt is a 64 y/o female with h/o syringomyelia, quadraparesis, PVD, CHF, admitted with slurred speech.  Pt positive for UTI.  Work up and treatment continues.   Clinical Impression   PTA, pt lived in ALF. Pt states that she feels weaker than her baseline and is interested in AE to assist with ADL. Will return this pm to issue AE so pt can feed self after set up. Pt would benefit from continued HHOT services after D/C. Will follow acutely to address established goals.     Follow Up Recommendations  Home health OT;Supervision/Assistance - 24 hour    Equipment Recommendations  None recommended by OT    Recommendations for Other Services       Precautions / Restrictions Precautions Precautions: Fall      Mobility Bed Mobility               General bed mobility comments: Pt able to assist with bed mobilty with pushing through BUE and pulling on rails to assist with rolling. would benefit from bed loops.   Transfers                    Balance                                            ADL Overall ADL's : Needs assistance/impaired Eating/Feeding: Minimal assistance Eating/Feeding Details (indicate cue type and reason): Pt would benefit from AE to assist with self feeeding. Pt states feeding self has become more difficult  Grooming: Minimal assistance                               Functional mobility during ADLs: Total assistance (or use of lift) General ADL Comments: Pt states she is having more difficulty with her ADL. Would like to have home health OT if covered by ins. Pt discussed her frustration with her current situation regarding her w/c.  Pt is in the process of working with [employer] on getting a w/c. Discussed her frustration with current ALF. Feels her independence  has steadily declined and is interested in working with HHOT/PT to regain her independence.  Pt will benefit from use of U-cuff/built up grip with curved spoon. Will also need to adapt "staub" to use for call bell/changing channels                     Pertinent Vitals/Pain Pain Assessment: No/denies pain     Hand Dominance Right   Extremity/Trunk Assessment  C6-7 level quad. Increased functional use RUE   Lower Extremity Assessment RLE Deficits / Details: spasms, can wag her foot, discernable, but non functional gross extention. RLE Sensation: decreased light touch RLE Coordination: decreased fine motor;decreased gross motor LLE Deficits / Details: see R LE, no discernable movement on left side. LLE Sensation: decreased light touch LLE Coordination: decreased fine motor;decreased gross motor   Cervical / Trunk Assessment Cervical / Trunk Assessment: Other exceptions;Kyphotic (scoliosis)   Communication Communication Communication: No difficulties   Cognition Arousal/Alertness: Awake/alert Behavior During Therapy: WFL for tasks assessed/performed Overall Cognitive Status: Within Functional Limits for tasks assessed  General Comments       Exercises       Shoulder Instructions      Home Living Family/patient expects to be discharged to:: Assisted living                             Home Equipment: Wheelchair - manual;Wheelchair - power   Additional Comments: Per pt report has both manual and power w/c but uses manual w/c with rim adaptations to allow her to self propel, otherwise is assisted at bed level for bathing and dressing and also is transferred to w/c via total A.       Prior Functioning/Environment Level of Independence: Needs assistance  Gait / Transfers Assistance Needed: total A with transfers or use of lift; Able to propel w/c ADL's / Homemaking Assistance Needed: able to self feed and assist minimally with  bathing   Comments: Pt states she has more difficulty with trunk control now that she no longer goes to the [facility]    OT Diagnosis: Generalized weakness;Paresis   OT Problem List: Decreased strength;Decreased range of motion;Decreased activity tolerance;Impaired balance (sitting and/or standing);Decreased coordination;Decreased knowledge of use of DME or AE;Impaired sensation;Impaired tone;Impaired UE functional use   OT Treatment/Interventions: Self-care/ADL training;Therapeutic exercise;Neuromuscular education;DME and/or AE instruction;Therapeutic activities;Patient/family education    OT Goals(Current goals can be found in the care plan section) Acute Rehab OT Goals Patient Stated Goal: I would love to continue therapy after the hospital OT Goal Formulation: With patient Time For Goal Achievement: 07/07/15 Potential to Achieve Goals: Good ADL Goals Pt Will Perform Eating: with set-up;with assist to don/doff brace/orthosis;with adaptive utensils;sitting Pt Will Perform Grooming: with set-up;with adaptive equipment;sitting;with supervision Pt/caregiver will Perform Home Exercise Program: Both right and left upper extremity;With theraputty;With Supervision (super soft - tan)  OT Frequency: Min 2X/week   Barriers to D/C:            Co-evaluation              End of Session Nurse Communication: Other (comment) (need for AE for ADL)  Activity Tolerance: Patient tolerated treatment well Patient left: in bed;with call bell/phone within reach   Time: 1355-1411 OT Time Calculation (min): 16 min Charges:  OT General Charges $OT Visit: 1 Procedure OT Evaluation $Initial OT Evaluation Tier I: 1 Procedure G-Codes:    Layken Doenges,HILLARY Jun 30, 2015, 3:32 PM   [facility], OTR/L  [phone] Jun 30, 2015

## 2015-06-23 NOTE — Progress Notes (Addendum)
Occupational Therapy Treatment Patient Details Name: Deanna Morgan MRN: [id number] DOB: October 26, 1950 Today's Date: 06/23/2015    History of present illness pt is a 64 y/o female with h/o syringomyelia, quadraparesis, PVD, CHF, admitted with slurred speech.  Pt positive for UTI.  Work up and treatment continues.   OT comments  Seen to assess for AE so pt can feed self during admission. Also fabricated adaptation so pt could operate TV/Nurse control call bell. Will continue to follow acutely to address established goals and maximize pt's level of independence.   Follow Up Recommendations  Home health OT;Supervision/Assistance - 24 hour    Equipment Recommendations  None recommended by OT    Recommendations for Other Services      Precautions / Restrictions Precautions Precautions: Fall       Mobility Bed Mobility               General bed mobility comments: Pt able to assist with bed mobilty with pushing through BUE and pulling on rails to assist with rolling. would benefit from bed loops.   Transfers         not addressed this session          Balance      Repositioned in bed to increase upright midline postural control                             ADL Overall ADL's : Needs assistance/impaired Eating/Feeding: Minimal assistance Eating/Feeding Details (indicate cue type and reason): Pt would benefit from AE to assist with self feeeding. Pt states feeding self has become more difficult  Grooming: Minimal assistance                               Functional mobility during ADLs: Total assistance (or use of lift) General ADL Comments: Pt issued U-cuff for self feeding and to use for brushing teeth. also assessed with use of built up curved spoon. Pt tends to hold utensil in her hand, but states this has become more difficult. Pt ffels the built up handle may work and wants to try using it. also demosntrated how to use U-cuff. Pt veralized  understanding. Called to cafeteria to have stnding order for coffee cup to be on tray to increaseindependence with drinking. also adapted "pointer" to use with call bell to call nurse and operate TV/light controls. given theraband to use as non-slip surface      Vision                     Perception     Praxis      Cognition   Behavior During Therapy: WFL for tasks assessed/performed Overall Cognitive Status: Within Functional Limits for tasks assessed                                            General Comments      Pertinent Vitals/ Pain       Pain Assessment: No/denies pain  Home Living Family/patient expects to be discharged to:: Assisted living                             Home Equipment: Wheelchair - manual;Wheelchair - power   Additional Comments:  Per pt report has both manual and power w/c but uses manual w/c with rim adaptations to allow her to self propel, otherwise is assisted at bed level for bathing and dressing and also is transferred to w/c via total A.       Prior Functioning/Environment Level of Independence: Needs assistance  Gait / Transfers Assistance Needed: total A with transfers or use of lift; Able to propel w/c ADL's / Homemaking Assistance Needed: able to self feed and assist minimally with bathing   Comments: Pt states she has more difficulty with trunk control now that she no longer goes to the [facility]   Frequency Min 2X/week     Progress Toward Goals  OT Goals(current goals can now be found in the care plan section)  Progress towards OT goals: Progressing toward goals  Acute Rehab OT Goals Patient Stated Goal: I would love to continue therapy after the hospital OT Goal Formulation: With patient Time For Goal Achievement: 07/07/15 Potential to Achieve Goals: Good ADL Goals Pt Will Perform Eating: with set-up;with assist to don/doff brace/orthosis;with adaptive utensils;sitting Pt Will Perform Grooming:  with set-up;with adaptive equipment;sitting;with supervision Pt/caregiver will Perform Home Exercise Program: Both right and left upper extremity;With theraputty;With Supervision (super soft - tan)  Plan Discharge plan remains appropriate    Co-evaluation                 End of Session     Activity Tolerance Patient tolerated treatment well   Patient Left in bed;with call bell/phone within reach   Nurse Communication Other (comment) (use of AE for feeding and need for coffee cup on trays)        Time: 1440-1500 OT Time Calculation (min): 20 min  Charges: OT General Charges $OT Visit: 1 Procedure OT Treatments $Self Care/Home Management : 8-22 mins  Deanna Morgan,HILLARY 06/23/2015, 3:47 PM   [facility], OTR/L  [phone] 06/23/2015

## 2015-06-23 NOTE — Clinical Documentation Improvement (Signed)
Internal Medicine  A cause and effect relationship may not be assumed and must be documented by a provider.  Please clarify the relationship, if any, between UTI and Foley catheter in progress notes and discharge summary.  Are the conditions:   Due to or associated with each other  Unrelated to each other  Other  Clinically Undetermined   Supporting Information   Quadriplegic female with recurrent UTIs and Chronic indwelling catheter  H&P: Pt has dwelling foley with foul smelling urine. In ED, patient was found to have positive urinalysis for UTI, WBC 5.4, lactate 0.58, hypothermia with temperature 95.7, bradycardia, tachypnea, hypotension, electrolytes and renal function okay.  12/14 Patient on admission was noted to be hypotensive, hypothermic, bradycardic, chest x-ray worrisome for pneumonia, urinalysis consistent with a UTI. 12/15   Assessment/Plan: #1 sepsis Likely secondary to HCAP vs aspiration PNA and urinary tract infection. Patient met criteria for sepsis on admission secondary to hypotension, hypothermia, tachypnea. Lactate was normal. Blood pressure responded to IV fluids. Urine Streptococcus pneumonia antigen is negative. Urine Legionella antigen pending. Sputum Gram stain and culture pending. Blood cultures pending. Influenza PCR is negative. Continue empiric IV vancomycin and Zosyn. Supportive care. IV fluids. Follow. urinary tract infection Urine cultures pending. Continue IV Zosyn.  Component     Latest Ref Rng 06/22/2015  Color, Urine     YELLOW AMBER (A)  Appearance     CLEAR CLOUDY (A)  Specific Gravity, Urine     1.005 - 1.030 1.025  pH     5.0 - 8.0 5.0  Glucose     NEGATIVE mg/dL NEGATIVE  Hgb urine dipstick     NEGATIVE NEGATIVE  Bilirubin Urine     NEGATIVE NEGATIVE  Ketones, ur     NEGATIVE mg/dL 15 (A)  Protein     NEGATIVE mg/dL NEGATIVE  Nitrite     NEGATIVE POSITIVE (A)  Leukocytes, UA     NEGATIVE LARGE (A)   Component     Latest  Ref Rng 06/22/2015  Squamous Epithelial / LPF     NONE SEEN 0-5 (A)  WBC, UA     0 - 5 WBC/hpf 6-30  RBC / HPF     0 - 5 RBC/hpf NONE SEEN  Bacteria, UA     NONE SEEN MANY (A)  Casts     NEGATIVE HYALINE CASTS (A)   Treatments:  UA & C&S IV Vancomycin q24h IV Zosyn q8h    Please exercise your independent, professional judgment when responding. A specific answer is not anticipated or expected.   Thank You,  [place] [phone]

## 2015-06-24 LAB — BASIC METABOLIC PANEL
Anion gap: 9 (ref 5–15)
BUN: 12 mg/dL (ref 6–20)
CHLORIDE: 106 mmol/L (ref 101–111)
CO2: 25 mmol/L (ref 22–32)
CREATININE: 0.87 mg/dL (ref 0.44–1.00)
Calcium: 8.9 mg/dL (ref 8.9–10.3)
GFR calc Af Amer: >60 mL/min (ref >60)
GFR calc non Af Amer: >60 mL/min (ref >60)
Glucose, Bld: 100 mg/dL — ABNORMAL HIGH (ref 65–99)
POTASSIUM: 4.1 mmol/L (ref 3.5–5.1)
Sodium: 140 mmol/L (ref 135–145)

## 2015-06-24 LAB — CBC
HCT: 32.5 % — ABNORMAL LOW (ref 36.0–46.0)
Hemoglobin: 10.4 g/dL — ABNORMAL LOW (ref 12.0–15.0)
MCH: 29.1 pg (ref 26.0–34.0)
MCHC: 32 g/dL (ref 30.0–36.0)
MCV: 91 fL (ref 78.0–100.0)
Platelets: 155 10*3/uL (ref 150–400)
RBC: 3.57 MIL/uL — ABNORMAL LOW (ref 3.87–5.11)
RDW: 13.8 % (ref 11.5–15.5)
WBC: 4.1 10*3/uL (ref 4.0–10.5)

## 2015-06-24 MED ORDER — AMOXICILLIN-POT CLAVULANATE 875-125 MG PO TABS
1.0000 | ORAL_TABLET | Freq: Two times a day (BID) | ORAL | Status: DC
  Administered 2015-06-24 – 2015-06-25 (×3): 1 via ORAL
  Filled 2015-06-24 (×3): qty 1
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
## 2015-06-24 NOTE — Progress Notes (Signed)
[facility] PROGRESS NOTE  Deanna Morgan ZOX:[id number]  DOB: 08-30-1950 DOA: 06/22/2015 PCP: Reymundo Poll, MD  Assessment/Plan: #1 sepsis Likely secondary to HCAP vs aspiration PNA and urinary tract infection. Patient met criteria for sepsis on admission secondary to hypotension, hypothermia, tachypnea. Lactate was normal. Blood pressure responded to IV fluids. Urine Streptococcus pneumonia antigen is negative. Urine Legionella antigen is negative. Sputum Gram stain and culture pending. Blood cultures pending. Influenza PCR is negative. D/C empiric IV vancomycin and Zosyn. Start augmentin. Supportive care. IV fluids. Follow.  #2 healthcare associated pneumonia versus aspiration pneumonia Patient currently afebrile. Hypothermia has improved. Tachypnea has improved. Hypotension has improved. Patient was seen by speech therapist and patient on regular thin liquids and is to sit upright and remain upright and minimum of 30 minutes after meals as per history of GERD places at a high risk of aspiration and also prone to pneumonias in general due to her quadriparesis. Urine Streptococcus pneumonia antigen is negative. Urine Legionella antigen is negative. Influenza PCR is negative. Blood cultures are pending. D/C IV Vancomycin and IV Zosyn. Start augmentin.  Follow.  #3 hypotension Likely secondary to volume depletion a problem #1. Blood pressure improved. NSL IVF. Continue empiric IV antibiotics.  #4 urinary tract infection vs bacteruria Urine cultures with multiple species. D/C IV Zosyn.  #5 chronic dislocation of shoulder/chronic pain Continue current pain regimen of MSContin BID. Continue MSIR for breakthrough pain.  #6 constipation Improved on current bowel regimen. Continue MiraLAX twice daily as well as Senokot-S.  #7 depression and anxiety Stable. Continue Zoloft and Xanax.  #8 hyperlipidemia Continue Zocor.  #9 chronic diastolic CHF 2-D echo from 03/22/2015 with a EF of  65-70% with grade 1 diastolic dysfunction. Patient is not on diuretics at home. Patient seems well compensated. NSL IVF. Monitor closely with hydration.  #10 slurred speech Patient states had slurred speech because was just awoken from sleep. Patient's symptoms and 12 resolved prior to presentation in the ED. Patient with no focal neurological deficits. MRI head negative for any acute infarct.  #11 prophylaxis PPI for GI prophylaxis. Heparin for DVT prophylaxis.  Code Status: DO NOT RESUSCITATE Family Communication: Updated patient and caregiver at bedside. Updated daughter via telephone. Disposition Plan: hopefully home tomorrow.   Consultants:  None  Procedures:  MRI head 06/22/2015  Chest x-ray 06/22/2015    Antibiotics: IV Zosyn 06/22/2015>>>>>>06/24/2015 IV vancomycin 06/22/2015>>>>06/23/2015 Oral Augmentin 06/24/2015  HPI/Subjective: Patient sitting up in bed. Caregiver at bedside. Denies any shortness of breath. No chest pain. Feels better.  Objective: Filed Vitals:   06/23/15 2039 06/24/15 0620  BP: 124/62 117/68  Pulse: 68 62  Temp: 98.6 F (37 C) 97.8 F (36.6 C)  Resp:  20    Intake/Output Summary (Last 24 hours) at 06/24/15 1214 Last data filed at 06/24/15 1126  Gross per 24 hour  Intake    460 ml  Output   2700 ml  Net  -2240 ml   Filed Weights   06/22/15 0800  Weight: 66.4 kg (146 lb 6.2 oz)    Exam:   General:  NAD  Cardiovascular: RRR  Respiratory: Some coarse BS in right base. No wheezing. No crackles.  Abdomen: Soft/NT/ND/+BS  Musculoskeletal: No c/c/e  Data Reviewed: Basic Metabolic Panel:  Recent Labs Lab 06/22/15 0250 06/23/15 1030 06/24/15 0500  NA 135 142 140  K 4.0 4.1 4.1  CL 105 109 106  CO2 23 24 25   GLUCOSE 102* 144* 100*  BUN 19 11 12   CREATININE 0.60  0.69 0.87  CALCIUM 8.5* 8.8* 8.9   Liver Function Tests:  Recent Labs Lab 06/22/15 0250  AST 14*  ALT 14  ALKPHOS 46  BILITOT 1.0  PROT 5.9*   ALBUMIN 3.4*   No results for input(s): LIPASE, AMYLASE in the last 168 hours. No results for input(s): AMMONIA in the last 168 hours. CBC:  Recent Labs Lab 06/22/15 0250 06/23/15 1030 06/24/15 0500  WBC 5.4 4.3 4.1  NEUTROABS 3.1  --   --   HGB 11.1* 12.1 10.4*  HCT 35.0* 37.0 32.5*  MCV 90.0 90.7 91.0  PLT 148* 145* 155   Cardiac Enzymes: No results for input(s): CKTOTAL, CKMB, CKMBINDEX, TROPONINI in the last 168 hours. BNP (last 3 results)  Recent Labs  06/23/15 1030  BNP 251.0*    ProBNP (last 3 results) No results for input(s): PROBNP in the last 8760 hours.  CBG: No results for input(s): GLUCAP in the last 168 hours.  Recent Results (from the past 240 hour(s))  Blood Culture (routine x 2)     Status: None (Preliminary result)   Collection Time: 06/22/15  2:49 AM  Result Value Ref Range Status   Specimen Description BLOOD RIGHT ARM  Final   Special Requests IN PEDIATRIC BOTTLE 3 ML  Final   Culture NO GROWTH 2 DAYS  Final   Report Status PENDING  Incomplete  Blood Culture (routine x 2)     Status: None (Preliminary result)   Collection Time: 06/22/15  2:53 AM  Result Value Ref Range Status   Specimen Description BLOOD RIGHT HAND  Final   Special Requests BOTTLES DRAWN AEROBIC AND ANAEROBIC 5ML  Final   Culture NO GROWTH 2 DAYS  Final   Report Status PENDING  Incomplete  Urine culture     Status: None   Collection Time: 06/22/15  3:07 AM  Result Value Ref Range Status   Specimen Description URINE, RANDOM  Final   Special Requests NONE  Final   Culture MULTIPLE SPECIES PRESENT, SUGGEST RECOLLECTION  Final   Report Status 06/23/2015 FINAL  Final  MRSA PCR Screening     Status: None   Collection Time: 06/22/15  7:35 AM  Result Value Ref Range Status   MRSA by PCR NEGATIVE NEGATIVE Final    Comment:        The GeneXpert MRSA Assay (FDA approved for NASAL specimens only), is one component of a comprehensive MRSA colonization surveillance program. It  is not intended to diagnose MRSA infection nor to guide or monitor treatment for MRSA infections.      Studies: No results found.  Scheduled Meds: . acidophilus  1 capsule Oral BID  . aspirin EC  81 mg Oral Daily  . baclofen  20 mg Oral BID  . docusate sodium  100 mg Oral BID  . heparin  5,000 Units Subcutaneous 3 times per day  . lactulose  30 g Oral Daily  . morphine  15 mg Oral Q12H  . multivitamin  1 tablet Oral QHS  . omega-3 acid ethyl esters  1,000 mg Oral Daily  . oxybutynin  15 mg Oral Daily  . pantoprazole  40 mg Oral Q0600  . piperacillin-tazobactam (ZOSYN)  IV  3.375 g Intravenous 3 times per day  . polyethylene glycol  17 g Oral BID  . psyllium  1 packet Oral QHS  . senna  1 tablet Oral QHS  . sertraline  50 mg Oral Q breakfast  . simvastatin  40 mg Oral  QHS  . sodium chloride  500 mL Intravenous Once  . sorbitol  30 mL Oral Once   Continuous Infusions:    Principal Problem:   Sepsis ([place]) Active Problems:   Aspiration pneumonia (HCC)   HCAP (healthcare-associated pneumonia)   Chronic diastolic (congestive) heart failure (HCC)   Quadriparesis (HCC)   Syringomyelia (HCC)   UTI (lower urinary tract infection)   Chronic dislocation of shoulder   Depression with anxiety   HLD (hyperlipidemia)   Slurred speech    Time spent: 8 mins    [facility] MD [facility] Pager [phone]. If 7PM-7AM, please contact night-coverage at [email or web address], password [facility] 06/24/2015, 12:14 PM  LOS: 2 days

## 2015-06-24 NOTE — Progress Notes (Signed)
PT Cancellation Note  Patient Details Name: Deanna Morgan MRN: [id number] DOB: [birth year]   Cancelled Treatment:    Reason Eval/Treat Not Completed: Patient declined, no reason specified. Pt was asleep and when aroused declined OOB.   Dustine Stickler 06/24/2015, 10:37 AM Skip Mayer PT [phone]

## 2015-06-24 NOTE — Care Management Note (Signed)
Case Management Note  Patient Details  Name: Deanna Morgan MRN: [id number] Date of Birth: 1950/12/26  Subjective/Objective:     Patient is from [facility], active with Genevieve Norlander for [name], PT, Ot, aide and Social Worker will resume HH services at discharge.               Action/Plan:   Expected Discharge Date:  06/25/15               Expected Discharge Plan:  Assisted Living / Rest Home  In-House Referral:  Clinical Social Work  Discharge planning Services  CM Consult  Post Acute Care Choice:  NA, Resumption of Svcs/PTA Provider Choice offered to:  NA  DME Arranged:  N/A DME Agency:  NA  HH Arranged:  NA, RN, PT, OT, Nurse's Aide, Social Work [employer] Agency:  NA, Armed forces logistics/support/administrative officer Home Health  Status of Service:  Completed, signed off  Medicare Important Message Given:    Date Medicare IM Given:    Medicare IM give by:    Date Additional Medicare IM Given:    Additional Medicare Important Message give by:     If discussed at Long Length of Stay Meetings, dates discussed:    Additional Comments:  Leone Haven, RN 06/24/2015, 4:27 PM

## 2015-06-25 MED ORDER — MORPHINE SULFATE 15 MG PO TABS: 15.0000 mg | ORAL_TABLET | ORAL | Status: DC | PRN

## 2015-06-25 MED ORDER — MORPHINE SULFATE ER 15 MG PO TBCR: 15.0000 mg | EXTENDED_RELEASE_TABLET | Freq: Two times a day (BID) | ORAL | Status: DC

## 2015-06-25 MED ORDER — AMOXICILLIN-POT CLAVULANATE 875-125 MG PO TABS: 1.0000 | ORAL_TABLET | Freq: Two times a day (BID) | ORAL | Status: DC

## 2015-06-25 MED ORDER — ALPRAZOLAM 0.5 MG PO TABS: 0.5000 mg | ORAL_TABLET | Freq: Three times a day (TID) | ORAL | Status: DC | PRN

## 2015-06-25 MED ORDER — PANTOPRAZOLE SODIUM 40 MG PO TBEC: 40.0000 mg | DELAYED_RELEASE_TABLET | Freq: Every day | ORAL | Status: AC

## 2015-06-25 MED ORDER — POLYETHYLENE GLYCOL 3350 17 G PO PACK: 17.0000 g | PACK | Freq: Two times a day (BID) | ORAL | Status: DC

## 2015-06-25 NOTE — Progress Notes (Signed)
Clinical Social Work  CSW faxed DC summary and FL2 to [employer] [facility] and spoke with [place] who reviewed information and is agreeable to accept pt. ALF to arrange [facility] PT and OT. DC packet prepared with DNR and hard scripts included. CSW met with pt at bedside who is aware of DC plans and reports dtr will transport her. CSW offered to call dtr but pt reports she has already called her and no needs. CSW updated RN that pt is ready to [place] is signing off but available if needed.  Sindy Messing, LCSW Weekend Coverage

## 2015-06-25 NOTE — NC FL2 (Signed)
[place] MEDICAID FL2 LEVEL OF CARE SCREENING TOOL     IDENTIFICATION  Patient Name: Deanna Morgan Birthdate: July 19, 1950 Sex: female Admission Date (Current Location): 06/22/2015  [facility] and [place] Number: Producer, television/film/video and Address:  [facility], [address] [place], [place] [address]      Provider Number: [id number]  Attending Physician Name and Address:  Rodolph Bong, MD  Relative Name and Phone Number:       Current Level of Care: Hospital Recommended Level of Care: Assisted Living Facility Prior Approval Number:    Date Approved/Denied:   PASRR Number:    Discharge Plan: Domiciliary (Rest home)    Current Diagnoses: Patient Active Problem List   Diagnosis Date Noted  . 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 UTI (lower urinary tract infection) 06/22/2015  . Sepsis (HCC) 06/22/2015  . 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 Depression with anxiety 06/22/2015  . HLD (hyperlipidemia) 06/22/2015  . Slurred speech 06/22/2015  . NSTEMI (non-ST elevated myocardial infarction) (HCC)   . Adynamic ileus (HCC)   . Constipation   . Quadriparesis (HCC)   . Pressure ulcer 03/19/2015  . HCAP (healthcare-associated pneumonia)   . Chronic diastolic (congestive) heart failure (HCC)   . CN (constipation)   . Acute respiratory failure with hypoxemia (HCC)   . Aspiration pneumonia (HCC) 03/14/2015  . Abdominal pain, acute   . Arterial hypotension   . Hypoxia   . Ileus (HCC)   . Infection due to port-a-cath     Orientation RESPIRATION BLADDER Height & Weight    Self, Time, Situation, Place  Normal Incontinent, Indwelling catheter  (160 cm) 146 lbs.  BEHAVIORAL SYMPTOMS/MOOD NEUROLOGICAL BOWEL NUTRITION STATUS      Continent Diet (Regular)  AMBULATORY STATUS COMMUNICATION OF NEEDS Skin   Limited Assist Verbally Normal                       Personal Care Assistance Level of Assistance  Bathing, Feeding,  Dressing Bathing Assistance: Maximum assistance Feeding assistance: Limited assistance Dressing Assistance: Maximum assistance     Functional Limitations Info  Sight, Hearing, Speech Sight Info: Adequate Hearing Info: Adequate Speech Info: Adequate    SPECIAL CARE FACTORS FREQUENCY  PT (By licensed PT), OT (By licensed OT)     PT Frequency: needs HH PT OT Frequency: needs HH OT            Contractures Contractures Info: Not present    Additional Factors Info  Code Status, Allergies Code Status Info: DNR Allergies Info: NKDA           Current Medications (06/25/2015):  This is the current hospital active medication list Current Facility-Administered Medications  Medication Dose Route Frequency Provider Last Rate Last Dose  . acidophilus (RISAQUAD) capsule 1 capsule  1 capsule Oral BID Lorretta Harp, MD   1 capsule at 06/25/15 1004  . albuterol (PROVENTIL) (2.5 MG/3ML) 0.083% nebulizer solution 2.5 mg  2.5 mg Nebulization Q4H PRN Lorretta Harp, MD      . ALPRAZolam Prudy Feeler) tablet 0.5 mg  0.5 mg Oral Q8H PRN Lorretta Harp, MD      . amoxicillin-clavulanate (AUGMENTIN) 875-125 MG per tablet 1 tablet  1 tablet Oral Q12H Rodolph Bong, MD   1 tablet at 06/25/15 1004  . aspirin EC tablet 81 mg  81 mg Oral Daily Lorretta Harp, MD   81 mg at 06/25/15 1004  . baclofen (LIORESAL) tablet 20  mg  20 mg Oral BID Lorretta Harp, MD   20 mg at 06/25/15 1004  . bisacodyl (DULCOLAX) suppository 10 mg  10 mg Rectal PRN Lorretta Harp, MD      . dextromethorphan-guaiFENesin [facility] DM) 30-600 MG per 12 hr tablet 1 tablet  1 tablet Oral BID PRN Lorretta Harp, MD      . docusate sodium (COLACE) capsule 100 mg  100 mg Oral BID Lorretta Harp, MD   100 mg at 06/25/15 1004  . heparin injection 5,000 Units  5,000 Units Subcutaneous 3 times per day Lorretta Harp, MD   5,000 Units at 06/25/15 0550  . lactulose (CHRONULAC) 10 GM/15ML solution 30 g  30 g Oral Daily Lorretta Harp, MD   30 g at 06/25/15 1003  . liver oil-zinc oxide  (DESITIN) 40 % ointment 1 application  1 application Topical BID PRN Lorretta Harp, MD      . magnesium citrate solution 1 Bottle  1 Bottle Oral QHS PRN Lorretta Harp, MD      . morphine (MS CONTIN) 12 hr tablet 15 mg  15 mg Oral Q12H Lorretta Harp, MD   15 mg at 06/25/15 1004  . morphine (MSIR) tablet 15 mg  15 mg Oral Q4H PRN Rodolph Bong, MD      . multivitamin (RENA-VIT) tablet 1 tablet  1 tablet Oral QHS Lorretta Harp, MD   1 tablet at 06/24/15 2212  . omega-3 acid ethyl esters (LOVAZA) capsule 1,000 mg  1,000 mg Oral Daily Lorretta Harp, MD   1,000 mg at 06/25/15 1004  . oxybutynin (DITROPAN XL) 24 hr tablet 15 mg  15 mg Oral Daily Lorretta Harp, MD   15 mg at 06/25/15 1004  . pantoprazole (PROTONIX) EC tablet 40 mg  40 mg Oral Q0600 Rodolph Bong, MD   40 mg at 06/25/15 0550  . polyethylene glycol (MIRALAX / GLYCOLAX) packet 17 g  17 g Oral BID Rodolph Bong, MD   17 g at 06/24/15 2212  . psyllium (HYDROCIL/METAMUCIL) packet 1 packet  1 packet Oral QHS Lorretta Harp, MD   1 packet at 06/22/15 2145  . senna (SENOKOT) tablet 8.6 mg  1 tablet Oral QHS Lorretta Harp, MD   8.6 mg at 06/24/15 2212  . sertraline (ZOLOFT) tablet 50 mg  50 mg Oral Q breakfast Lorretta Harp, MD   50 mg at 06/25/15 0841  . simvastatin (ZOCOR) tablet 40 mg  40 mg Oral QHS Lorretta Harp, MD   40 mg at 06/24/15 2212  . sodium chloride 0.9 % bolus 500 mL  500 mL Intravenous Once Lorretta Harp, MD      . sorbitol 70 % solution 30 mL  30 mL Oral Once Rodolph Bong, MD   30 mL at 06/22/15 2054     Discharge Medications: Current Discharge Medication List    START taking these medications   Details  amoxicillin-clavulanate (AUGMENTIN) 875-125 MG tablet Take 1 tablet by mouth every 12 (twelve) hours. Take for 4 days then stop. Qty: 8 tablet, Refills: 0    morphine (MSIR) 15 MG tablet Take 1 tablet (15 mg total) by mouth every 4 (four) hours as needed for severe pain. Qty: 20 tablet, Refills: 0    pantoprazole (PROTONIX) 40 MG tablet  Take 1 tablet (40 mg total) by mouth daily at 6 (six) AM. Qty: 30 tablet, Refills: 0      CONTINUE these medications which have CHANGED   Details  ALPRAZolam (XANAX) 0.5  MG tablet Take 1 tablet (0.5 mg total) by mouth every 8 (eight) hours as needed for anxiety. Qty: 20 tablet, Refills: 0    morphine (MS CONTIN) 15 MG 12 hr tablet Take 1 tablet (15 mg total) by mouth every 12 (twelve) hours. Qty: 20 tablet, Refills: 0    polyethylene glycol (MIRALAX / GLYCOLAX) packet Take 17 g by mouth 2 (two) times daily. Qty: 14 each, Refills: 0      CONTINUE these medications which have NOT CHANGED   Details  acidophilus (RISAQUAD) CAPS capsule Take 1 capsule by mouth 2 (two) times daily.    aspirin 81 MG tablet Take 81 mg by mouth daily.    baclofen (LIORESAL) 20 MG tablet Take 20 mg by mouth 2 (two) times daily.    bisacodyl (LAXATIVE) 10 MG suppository Place 10 mg rectally as needed for mild constipation or moderate constipation.    Chloroxylenol-Zinc Oxide (BAZA EX) Apply 1 application topically 2 (two) times daily as needed (for buttocks). Apply to buttocks until healed    Cranberry 425 MG CAPS Take 425 mg by mouth 2 (two) times daily.    docusate sodium (COLACE) 100 MG capsule Take 100 mg by mouth 2 (two) times daily.    ketoconazole (NIZORAL) 2 % shampoo Apply 1 application topically 2 (two) times a week. Provide and apply on skin as directed. Lather and let sit for a few minutes prior to rinsing when showering.    lactulose, encephalopathy, (GENERLAC) 10 GM/15ML SOLN Take 30 g by mouth daily.    magnesium citrate SOLN Take 1 Bottle by mouth at bedtime as needed for severe constipation.    Mineral Oil Heavy OIL Place 2 drops into both ears once a week.    Multiple Vitamin (DAILY VITE PO) Take 1 tablet by mouth daily.    Omega 3 1000 MG CAPS Take 1,000 mg by mouth daily.    oxybutynin (DITROPAN XL) 15 MG 24 hr tablet Take  15 mg by mouth daily.    Psyllium 500 MG CAPS Take 2 capsules by mouth at bedtime.    senna (SENOKOT) 8.6 MG TABS tablet Take 1 tablet by mouth at bedtime.    sertraline (ZOLOFT) 50 MG tablet Take 50 mg by mouth daily with breakfast.     simvastatin (ZOCOR) 40 MG tablet Take 40 mg by mouth at bedtime.           Relevant Imaging Results:  Relevant Lab Results:   Additional Information    Marnee Spring, LCSW

## 2015-06-25 NOTE — Progress Notes (Signed)
Cm received call from RN, Zella Ball stating patient returning to ALF and wanted to know who would be handling that. CM called and spoke with [place] with SW and she said that she was working on return to ALF. No further CM needs communicated.

## 2015-06-25 NOTE — Progress Notes (Signed)
06/25/15 Patient to be discharged to [facility], IV site removed and paperwork and arrangements made go via daughter's transport car.
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
## 2015-06-27 LAB — CULTURE, BLOOD (ROUTINE X 2)
CULTURE: NO GROWTH
CULTURE: NO GROWTH

## 2016-09-27 ENCOUNTER — Emergency Department (HOSPITAL_COMMUNITY): Payer: Medicare Other

## 2016-09-27 ENCOUNTER — Emergency Department (HOSPITAL_COMMUNITY)
Admission: EM | Admit: 2016-09-27 | Discharge: 2016-09-28 | Disposition: A | Discharge status: Home / Self Care | Payer: Medicare Other | Attending: Emergency Medicine | Admitting: Emergency Medicine

## 2016-09-27 ENCOUNTER — Encounter (HOSPITAL_COMMUNITY): Payer: Self-pay | Admitting: Emergency Medicine

## 2016-09-27 DIAGNOSIS — Z79899 Other long term (current) drug therapy: Secondary | ICD-10-CM | POA: Insufficient documentation

## 2016-09-27 DIAGNOSIS — I5032 Chronic diastolic (congestive) heart failure: Secondary | ICD-10-CM | POA: Diagnosis not present

## 2016-09-27 DIAGNOSIS — N390 Urinary tract infection, site not specified: Secondary | ICD-10-CM

## 2016-09-27 DIAGNOSIS — I252 Old myocardial infarction: Secondary | ICD-10-CM | POA: Diagnosis not present

## 2016-09-27 DIAGNOSIS — K5900 Constipation, unspecified: Secondary | ICD-10-CM | POA: Diagnosis not present

## 2016-09-27 DIAGNOSIS — I110 Hypertensive heart disease with heart failure: Secondary | ICD-10-CM | POA: Diagnosis not present

## 2016-09-27 DIAGNOSIS — Z7982 Long term (current) use of aspirin: Secondary | ICD-10-CM | POA: Insufficient documentation

## 2016-09-27 DIAGNOSIS — R109 Unspecified abdominal pain: Secondary | ICD-10-CM | POA: Diagnosis present

## 2016-09-27 LAB — URINALYSIS, ROUTINE W REFLEX MICROSCOPIC
Bilirubin Urine: NEGATIVE
GLUCOSE, UA: NEGATIVE mg/dL
Ketones, ur: 5 mg/dL — AB
NITRITE: POSITIVE — AB
PH: 6 (ref 5.0–8.0)
PROTEIN: NEGATIVE mg/dL
Specific Gravity, Urine: 1.009 (ref 1.005–1.030)

## 2016-09-27 LAB — COMPREHENSIVE METABOLIC PANEL
ALK PHOS: 55 U/L (ref 38–126)
ALT: 22 U/L (ref 14–54)
AST: 25 U/L (ref 15–41)
Albumin: 4.5 g/dL (ref 3.5–5.0)
Anion gap: 11 (ref 5–15)
BILIRUBIN TOTAL: 1 mg/dL (ref 0.3–1.2)
BUN: 12 mg/dL (ref 6–20)
CALCIUM: 9.3 mg/dL (ref 8.9–10.3)
CO2: 24 mmol/L (ref 22–32)
Chloride: 99 mmol/L — ABNORMAL LOW (ref 101–111)
Creatinine, Ser: 0.71 mg/dL (ref 0.44–1.00)
Glucose, Bld: 106 mg/dL — ABNORMAL HIGH (ref 65–99)
Potassium: 3.9 mmol/L (ref 3.5–5.1)
Sodium: 134 mmol/L — ABNORMAL LOW (ref 135–145)
Total Protein: 7.6 g/dL (ref 6.5–8.1)

## 2016-09-27 LAB — CBC WITH DIFFERENTIAL/PLATELET
BASOS ABS: 0 10*3/uL (ref 0.0–0.1)
Basophils Relative: 0 %
EOS PCT: 1 %
Eosinophils Absolute: 0.1 10*3/uL (ref 0.0–0.7)
HCT: 36.9 % (ref 36.0–46.0)
Hemoglobin: 12.4 g/dL (ref 12.0–15.0)
LYMPHS PCT: 21 %
Lymphs Abs: 1.2 10*3/uL (ref 0.7–4.0)
MCH: 28.5 pg (ref 26.0–34.0)
MCHC: 33.6 g/dL (ref 30.0–36.0)
MCV: 84.8 fL (ref 78.0–100.0)
Monocytes Absolute: 0.5 10*3/uL (ref 0.1–1.0)
Monocytes Relative: 8 %
NEUTROS ABS: 3.9 10*3/uL (ref 1.7–7.7)
NEUTROS PCT: 70 %
PLATELETS: 230 10*3/uL (ref 150–400)
RBC: 4.35 MIL/uL (ref 3.87–5.11)
RDW: 13.4 % (ref 11.5–15.5)
WBC: 5.6 10*3/uL (ref 4.0–10.5)

## 2016-09-27 LAB — LIPASE, BLOOD: Lipase: 14 U/L (ref 11–51)

## 2016-09-27 MED ORDER — PEG 3350-KCL-NABCB-NACL-NASULF 236 G PO SOLR: ORAL | 0 refills | Status: DC

## 2016-09-27 MED ORDER — CEPHALEXIN 500 MG PO CAPS: 500.0000 mg | ORAL_CAPSULE | Freq: Two times a day (BID) | ORAL | 0 refills | Status: DC

## 2016-09-27 NOTE — ED Notes (Signed)
Bed: GY69 Expected date:  Expected time:  Means of arrival:  Comments: 66 yo abd pain, incontinence, uti

## 2016-09-27 NOTE — Discharge Instructions (Addendum)
Keep well hydrated to maintain your urine clear. Take your full course of antibiotics. Follow up with your primary care provider. Return to the emergency department if you develop a fever, symptoms worsen, you become confused or have any new concerning symptoms in the meantime.

## 2016-09-27 NOTE — ED Triage Notes (Signed)
Per [facility] pt from [facility] senior [facility]. Co abd pain and incontinence. Possible UTI. denies dysuria nor hematuria.

## 2016-09-27 NOTE — ED Provider Notes (Signed)
[facility]-EMERGENCY DEPT Provider Note   CSN: [id number] Arrival date & time: 09/27/16  1308     History   Chief Complaint Chief Complaint  Patient presents with  . Abdominal Pain    HPI Deanna Morgan is a 66 y.o. female presenting from [facility] not feeling too well since Monday being bloated gas and some nausea which has been baseline for her. She has had a bowel movement after suppository which was nonbloody and soft. She also reports not sleeping well and having decreased appetite. She thought she probably has another UTI. No vomiting, fever, chills, myalgias, symptoms, dysuria, hematuria, melena. HPI  Past Medical History:  Diagnosis Date  . Anxiety   . 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 CHF (congestive heart failure) (HCC)   . Chronic constipation   . Chronic pain   . Chronic UTI   . GERD (gastroesophageal reflux disease)   . Hyperlipidemia   . Hypertension   . Neuromuscular disorder (HCC)   . Paralysis (HCC)   . Peripheral vascular disease (HCC)   . Shortness of breath dyspnea   . Syringomyelia [facility])     Patient Active Problem List   Diagnosis Date Noted  . 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 UTI (lower urinary tract infection) 06/22/2015  . Sepsis (HCC) 06/22/2015  . 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 Depression with anxiety 06/22/2015  . HLD (hyperlipidemia) 06/22/2015  . Slurred speech 06/22/2015  . NSTEMI (non-ST elevated myocardial infarction) (HCC)   . Adynamic ileus (HCC)   . Constipation   . Quadriparesis (HCC)   . Pressure ulcer 03/19/2015  . HCAP (healthcare-associated pneumonia)   . Chronic diastolic (congestive) heart failure   . CN (constipation)   . Acute respiratory failure with hypoxemia (HCC)   . Aspiration pneumonia (HCC) 03/14/2015  . Abdominal pain, acute   . Arterial hypotension   . Hypoxia   . Ileus (HCC)   . Infection due to port-a-cath     Past Surgical History:  Procedure Laterality Date  . NECK  SURGERY      OB History    No data available       Home Medications    Prior to Admission medications   Medication Sig Start Date End Date Taking? Authorizing Provider  acetaminophen (TYLENOL) 500 MG tablet Take 500 mg by mouth every 6 (six) hours as needed for moderate pain.   Yes Historical Provider, MD  acidophilus (RISAQUAD) CAPS capsule Take 1 capsule by mouth 2 (two) times daily.   Yes Historical Provider, MD  aspirin 81 MG tablet Take 81 mg by mouth daily.   Yes Historical Provider, MD  baclofen (LIORESAL) 20 MG tablet Take 20 mg by mouth 2 (two) times daily.   Yes Historical Provider, MD  bisacodyl (LAXATIVE) 10 MG suppository Place 10 mg rectally 3 (three) times a week. MWF   Yes Historical Provider, MD  Cranberry 425 MG CAPS Take 425 mg by mouth 2 (two) times daily.   Yes Historical Provider, MD  docusate sodium (COLACE) 100 MG capsule Take 100 mg by mouth 2 (two) times daily.   Yes Historical Provider, MD  fesoterodine (TOVIAZ) 8 MG TB24 tablet Take 8 mg by mouth daily.   Yes Historical Provider, MD  lactulose, encephalopathy, (GENERLAC) 10 GM/15ML SOLN Take 45 g by mouth daily.    Yes Historical Provider, MD  magnesium hydroxide (MILK OF MAGNESIA) 400 MG/5ML suspension Take 30 mLs by mouth at bedtime.   Yes Historical Provider, MD  Magnesium Oxide (MAG-OX 400 PO) Take 1 tablet by mouth daily.   Yes Historical Provider, MD  Methylnaltrexone Bromide 150 MG TABS Take 3 tablets by mouth 3 (three) times a week. MWF   Yes Historical Provider, MD  Mineral Oil Heavy OIL Place 2 drops into both ears once a week.   Yes Historical Provider, MD  morphine (MS CONTIN) 15 MG 12 hr tablet Take 1 tablet (15 mg total) by mouth every 12 (twelve) hours. Patient taking differently: Take 15 mg by mouth daily.  06/25/15  Yes Rodolph Bong, MD  morphine (MS CONTIN) 15 MG 12 hr tablet Take 15 mg by mouth daily as needed for pain.   Yes Historical Provider, MD  Multiple Vitamin (DAILY VITE PO)  Take 1 tablet by mouth daily.   Yes Historical Provider, MD  nystatin cream (MYCOSTATIN) Apply 1 application topically 3 (three) times daily.   Yes Historical Provider, MD  Omega 3 1000 MG CAPS Take 1,000 mg by mouth daily.   Yes Historical Provider, MD  ondansetron (ZOFRAN) 4 MG tablet Take 4 mg by mouth every 6 (six) hours as needed for nausea or vomiting.   Yes Historical Provider, MD  pantoprazole (PROTONIX) 40 MG tablet Take 1 tablet (40 mg total) by mouth daily at 6 (six) AM. 06/25/15  Yes Rodolph Bong, MD  polyethylene glycol (MIRALAX / GLYCOLAX) packet Take 17 g by mouth 2 (two) times daily. Patient taking differently: Take 17 g by mouth 2 (two) times a week. Mon and Thurs 06/25/15  Yes Rodolph Bong, MD  psyllium (REGULOID) 0.52 g capsule Take 1.04 g by mouth daily.   Yes Historical Provider, MD  senna (SENOKOT) 8.6 MG TABS tablet Take 2 tablets by mouth at bedtime.    Yes Historical Provider, MD  sertraline (ZOLOFT) 50 MG tablet Take 50 mg by mouth daily with breakfast.    Yes Historical Provider, MD  simvastatin (ZOCOR) 40 MG tablet Take 40 mg by mouth at bedtime.   Yes Historical Provider, MD  ALPRAZolam Prudy Feeler) 0.5 MG tablet Take 1 tablet (0.5 mg total) by mouth every 8 (eight) hours as needed for anxiety. Patient not taking: Reported on 09/27/2016 06/25/15   Rodolph Bong, MD  amoxicillin-clavulanate (AUGMENTIN) 875-125 MG tablet Take 1 tablet by mouth every 12 (twelve) hours. Take for 4 days then stop. Patient not taking: Reported on 09/27/2016 06/25/15   Rodolph Bong, MD  cephALEXin (KEFLEX) 500 MG capsule Take 1 capsule (500 mg total) by mouth 2 (two) times daily. 09/27/16 10/04/16  Georgiana Shore, PA-C  morphine (MSIR) 15 MG tablet Take 1 tablet (15 mg total) by mouth every 4 (four) hours as needed for severe pain. Patient not taking: Reported on 09/27/2016 06/25/15   Rodolph Bong, MD    Family History Family History  Problem Relation Age of Onset  .  Heart failure Mother   . Heart failure Father     Social History Social History  Substance Use Topics  . Smoking status: Never Smoker  . Smokeless tobacco: Not on file  . Alcohol use No     Allergies   Patient has no known allergies.   Review of Systems Review of Systems  Constitutional: Negative for chills and fever.  HENT: Negative for ear pain and sore throat.   Eyes: Negative for pain and visual disturbance.  Respiratory: Negative for cough, choking, chest tightness, shortness of breath, wheezing and stridor.   Cardiovascular: Negative for chest pain,  palpitations and leg swelling.  Gastrointestinal: Positive for abdominal distention and nausea. Negative for abdominal pain, blood in stool, diarrhea and vomiting.  Genitourinary: Negative for difficulty urinating, dysuria, flank pain, frequency and hematuria.  Musculoskeletal: Negative for arthralgias, back pain, neck pain and neck stiffness.  Skin: Negative for color change, pallor and rash.  Neurological: Negative for dizziness, seizures, syncope, speech difficulty, weakness, light-headedness and numbness.  All other systems reviewed and are negative.    Physical Exam Updated Vital Signs BP (!) 165/105 (BP Location: Left Arm)   Pulse 80   Temp 98.1 F (36.7 C) (Oral)   Resp (!) 24   SpO2 97%   Physical Exam  Constitutional: She appears well-developed and well-nourished. No distress.  Afebrile, nontoxic-appearing, lying comfortably in bed in no acute distress.  HENT:  Head: Normocephalic and atraumatic.  Eyes: Conjunctivae are normal.  Neck: Neck supple.  Cardiovascular: Normal rate, regular rhythm, normal heart sounds and intact distal pulses.   No murmur heard. Pulmonary/Chest: Effort normal and breath sounds normal. No respiratory distress. She has no wheezes. She has no rales. She exhibits no tenderness.  Abdominal: Soft. She exhibits distension. She exhibits no mass. There is no tenderness. There is no  rebound and no guarding.  Musculoskeletal: She exhibits no edema.  Neurological: She is alert.  Skin: Skin is warm and dry. No rash noted. She is not diaphoretic. No erythema. No pallor.  Psychiatric: She has a normal mood and affect. Her behavior is normal.  Nursing note and vitals reviewed.    ED Treatments / Results  Labs (all labs ordered are listed, but only abnormal results are displayed) Labs Reviewed  COMPREHENSIVE METABOLIC PANEL - Abnormal; Notable for the following:       Result Value   Sodium 134 (*)    Chloride 99 (*)    Glucose, Bld 106 (*)    All other components within normal limits  URINALYSIS, ROUTINE W REFLEX MICROSCOPIC - Abnormal; Notable for the following:    APPearance HAZY (*)    Hgb urine dipstick MODERATE (*)    Ketones, ur 5 (*)    Nitrite POSITIVE (*)    Leukocytes, UA LARGE (*)    Bacteria, UA MANY (*)    Squamous Epithelial / LPF 0-5 (*)    All other components within normal limits  URINE CULTURE  LIPASE, BLOOD  CBC WITH DIFFERENTIAL/PLATELET    EKG  EKG Interpretation None       Radiology No results found.  Procedures Procedures (including critical care time)  Medications Ordered in ED Medications - No data to display   Initial Impression / Assessment and Plan / ED Course  I have reviewed the triage vital signs and the nursing notes.  Pertinent labs & imaging results that were available during my care of the patient were reviewed by me and considered in my medical decision making (see chart for details).     66 year old female presenting with multiple symptoms. She generally states that she has not been feeling too well since Monday has had a decreased appetite but is still eating. She has been distended which is baseline for her. She was given a suppository and has had bowel movements and passing gas.  Labs unremarkable UA with signs of UTI. Ordered urine culture.  Patient was discussed with Dr. Fayrene Fearing who has seen patient  and agrees with assessment and plan. Transferred patient care at end of shift to Dr. Fayrene Fearing pending acute abdominal series.  Anticipated discharge home  with Keflex if negative with close follow-up with PCP.  Final Clinical Impressions(s) / ED Diagnoses   Final diagnoses:  Lower urinary tract infectious disease    New Prescriptions New Prescriptions   CEPHALEXIN (KEFLEX) 500 MG CAPSULE    Take 1 capsule (500 mg total) by mouth 2 (two) times daily.     Georgiana Shore, PA-C 09/27/16 2302    Rolland Porter, MD 09/27/16 [phone]

## 2016-09-30 ENCOUNTER — Encounter (HOSPITAL_COMMUNITY): Payer: Self-pay | Admitting: Emergency Medicine

## 2016-09-30 ENCOUNTER — Emergency Department (HOSPITAL_COMMUNITY): Payer: Medicare Other

## 2016-09-30 ENCOUNTER — Emergency Department (HOSPITAL_COMMUNITY)
Admission: EM | Admit: 2016-09-30 | Discharge: 2016-09-30 | Disposition: A | Discharge status: Home / Self Care | Payer: Medicare Other | Attending: Emergency Medicine | Admitting: Emergency Medicine

## 2016-09-30 DIAGNOSIS — T83098A Other mechanical complication of other indwelling urethral catheter, initial encounter: Secondary | ICD-10-CM | POA: Diagnosis not present

## 2016-09-30 DIAGNOSIS — I110 Hypertensive heart disease with heart failure: Secondary | ICD-10-CM | POA: Diagnosis not present

## 2016-09-30 DIAGNOSIS — Z79899 Other long term (current) drug therapy: Secondary | ICD-10-CM | POA: Diagnosis not present

## 2016-09-30 DIAGNOSIS — I252 Old myocardial infarction: Secondary | ICD-10-CM | POA: Insufficient documentation

## 2016-09-30 DIAGNOSIS — Y733 Surgical instruments, materials and gastroenterology and urology devices (including sutures) associated with adverse incidents: Secondary | ICD-10-CM | POA: Diagnosis not present

## 2016-09-30 DIAGNOSIS — Z7982 Long term (current) use of aspirin: Secondary | ICD-10-CM | POA: Insufficient documentation

## 2016-09-30 DIAGNOSIS — R1084 Generalized abdominal pain: Secondary | ICD-10-CM

## 2016-09-30 DIAGNOSIS — K5900 Constipation, unspecified: Secondary | ICD-10-CM | POA: Diagnosis not present

## 2016-09-30 DIAGNOSIS — R1030 Lower abdominal pain, unspecified: Secondary | ICD-10-CM | POA: Diagnosis present

## 2016-09-30 DIAGNOSIS — I5032 Chronic diastolic (congestive) heart failure: Secondary | ICD-10-CM | POA: Diagnosis not present

## 2016-09-30 DIAGNOSIS — T83018A Breakdown (mechanical) of other indwelling urethral catheter, initial encounter: Secondary | ICD-10-CM

## 2016-09-30 LAB — URINALYSIS, ROUTINE W REFLEX MICROSCOPIC
BILIRUBIN URINE: NEGATIVE
Glucose, UA: NEGATIVE mg/dL
KETONES UR: NEGATIVE mg/dL
Nitrite: NEGATIVE
PROTEIN: NEGATIVE mg/dL
Specific Gravity, Urine: 1.006 (ref 1.005–1.030)
pH: 5 (ref 5.0–8.0)

## 2016-09-30 LAB — COMPREHENSIVE METABOLIC PANEL
ALBUMIN: 4.7 g/dL (ref 3.5–5.0)
ALK PHOS: 62 U/L (ref 38–126)
ALT: 27 U/L (ref 14–54)
ANION GAP: 14 (ref 5–15)
AST: 42 U/L — ABNORMAL HIGH (ref 15–41)
BUN: 31 mg/dL — ABNORMAL HIGH (ref 6–20)
CO2: 22 mmol/L (ref 22–32)
Calcium: 9.3 mg/dL (ref 8.9–10.3)
Chloride: 100 mmol/L — ABNORMAL LOW (ref 101–111)
Creatinine, Ser: 1.14 mg/dL — ABNORMAL HIGH (ref 0.44–1.00)
GFR calc non Af Amer: 49 mL/min — ABNORMAL LOW (ref >60)
GFR, EST AFRICAN AMERICAN: 57 mL/min — AB (ref >60)
Glucose, Bld: 117 mg/dL — ABNORMAL HIGH (ref 65–99)
POTASSIUM: 4.7 mmol/L (ref 3.5–5.1)
SODIUM: 136 mmol/L (ref 135–145)
Total Bilirubin: 0.5 mg/dL (ref 0.3–1.2)
Total Protein: 8.2 g/dL — ABNORMAL HIGH (ref 6.5–8.1)

## 2016-09-30 LAB — CBC
HEMATOCRIT: 41.5 % (ref 36.0–46.0)
HEMOGLOBIN: 13.6 g/dL (ref 12.0–15.0)
MCH: 28.6 pg (ref 26.0–34.0)
MCHC: 32.8 g/dL (ref 30.0–36.0)
MCV: 87.2 fL (ref 78.0–100.0)
Platelets: 225 10*3/uL (ref 150–400)
RBC: 4.76 MIL/uL (ref 3.87–5.11)
RDW: 13.7 % (ref 11.5–15.5)
WBC: 17.8 10*3/uL — ABNORMAL HIGH (ref 4.0–10.5)

## 2016-09-30 LAB — LIPASE, BLOOD: Lipase: 21 U/L (ref 11–51)

## 2016-09-30 MED ORDER — MORPHINE SULFATE (PF) 4 MG/ML IV SOLN
6.0000 mg | Freq: Once | INTRAVENOUS | Status: AC
  Administered 2016-09-30: 6 mg via INTRAVENOUS
  Filled 2016-09-30: qty 2

## 2016-09-30 MED ORDER — IOPAMIDOL (ISOVUE-300) INJECTION 61%
INTRAVENOUS | Status: AC
  Filled 2016-09-30: qty 100

## 2016-09-30 MED ORDER — IOPAMIDOL (ISOVUE-300) INJECTION 61%
100.0000 mL | Freq: Once | INTRAVENOUS | Status: AC | PRN
  Administered 2016-09-30: 100 mL via INTRAVENOUS

## 2016-09-30 MED ORDER — CEPHALEXIN 500 MG PO CAPS: 500.0000 mg | ORAL_CAPSULE | Freq: Three times a day (TID) | ORAL | 0 refills | Status: DC

## 2016-09-30 MED ORDER — CEPHALEXIN 500 MG PO CAPS
500.0000 mg | ORAL_CAPSULE | Freq: Once | ORAL | Status: AC
  Administered 2016-09-30: 500 mg via ORAL
  Filled 2016-09-30: qty 1

## 2016-09-30 MED ORDER — POLYETHYLENE GLYCOL 3350 17 G PO PACK: 17.0000 g | PACK | Freq: Two times a day (BID) | ORAL | 0 refills | Status: AC

## 2016-09-30 NOTE — ED Notes (Signed)
Changed urinary drainage bag.  

## 2016-09-30 NOTE — ED Notes (Signed)
I emptied her foley before d/c, which was draining clear pink-tinged urine.

## 2016-09-30 NOTE — ED Notes (Signed)
Pt has not been able to produce urine

## 2016-09-30 NOTE — ED Triage Notes (Signed)
Per EMS pt is from [facility]  Pt was seen here on Thursday and was diagnosed with a bladder infection and started on Keflex  Pt states she continues to have pain in her bladder area and now in her abdomen  Pt has abdominal distention noted  Pt states her last decent BM was on Monday  Pt states she used a suppository on Wednesday and had small results  Pt states she repeated a suppository tonight and had no results  Pt states she takes milk of mag daily but it does not help

## 2016-09-30 NOTE — ED Notes (Signed)
Pt reports not having really any urine output since bag replacement on 3/15.  Reports that facility staff inflated cath balloon with 20 ml saline instead of 10 ml d/t chronic use and hx of catheter slipping out on its own.  I took 10 ml out of the balloon and urine began to flow freely into clean tube/bag changed earlier in her visit by [facility] EMT.  600 ml straw colored urine.  Got urine sample.  Replaced entire urinary catheter set per [facility] order.  Bloody urine return.  Balloon inflated with only 10 ml of saline.  MD [facility] aware.

## 2016-10-01 LAB — URINE CULTURE

## 2016-10-01 NOTE — ED Provider Notes (Signed)
[facility]-EMERGENCY DEPT Provider Note   CSN: [id number] Arrival date & time: 09/30/16  0411     History   Chief Complaint Chief Complaint  Patient presents with  . Abdominal Pain    HPI Deanna Morgan is a 66 y.o. female.  HPI Patient presents the emergency department from [facility] for ongoing abdominal pain and swelling as well as suprapubic pain which the patient believes could be coming from her bladder.  She has a chronic indwelling Foley catheter.  She also feels like she has not had a good bowel movement over the past 6-7 days.  She's tried suppositories medications at home without improvement in her bowel movements.  No fevers or chills.  No productive cough.  No chest pain.  She feels like she's had decreased urine output into her leg back   Past Medical History:  Diagnosis Date  . Anxiety   . 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 CHF (congestive heart failure) (HCC)   . Chronic constipation   . Chronic pain   . Chronic UTI   . GERD (gastroesophageal reflux disease)   . Hyperlipidemia   . Hypertension   . Neuromuscular disorder (HCC)   . Paralysis (HCC)   . Peripheral vascular disease (HCC)   . Shortness of breath dyspnea   . Syringomyelia [facility])     Patient Active Problem List   Diagnosis Date Noted  . 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 UTI (lower urinary tract infection) 06/22/2015  . Sepsis (HCC) 06/22/2015  . 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 Depression with anxiety 06/22/2015  . HLD (hyperlipidemia) 06/22/2015  . Slurred speech 06/22/2015  . NSTEMI (non-ST elevated myocardial infarction) (HCC)   . Adynamic ileus (HCC)   . Constipation   . Quadriparesis (HCC)   . Pressure ulcer 03/19/2015  . HCAP (healthcare-associated pneumonia)   . Chronic diastolic (congestive) heart failure   . CN (constipation)   . Acute respiratory failure with hypoxemia (HCC)   . Aspiration pneumonia (HCC) 03/14/2015  . Abdominal pain, acute   .  Arterial hypotension   . Hypoxia   . Ileus (HCC)   . Infection due to port-a-cath     Past Surgical History:  Procedure Laterality Date  . NECK SURGERY      OB History    No data available       Home Medications    Prior to Admission medications   Medication Sig Start Date End Date Taking? Authorizing Provider  acetaminophen (TYLENOL) 500 MG tablet Take 500 mg by mouth every 6 (six) hours as needed for moderate pain.   Yes Historical Provider, MD  acidophilus (RISAQUAD) CAPS capsule Take 1 capsule by mouth 2 (two) times daily.   Yes Historical Provider, MD  aspirin 81 MG tablet Take 81 mg by mouth daily.   Yes Historical Provider, MD  baclofen (LIORESAL) 20 MG tablet Take 20 mg by mouth 2 (two) times daily.   Yes Historical Provider, MD  bisacodyl (LAXATIVE) 10 MG suppository Place 10 mg rectally 3 (three) times a week. MWF   Yes Historical Provider, MD  Cranberry 425 MG CAPS Take 425 mg by mouth 2 (two) times daily.   Yes Historical Provider, MD  docusate sodium (COLACE) 100 MG capsule Take 100 mg by mouth 2 (two) times daily.   Yes Historical Provider, MD  fesoterodine (TOVIAZ) 8 MG TB24 tablet Take 8 mg by mouth daily.   Yes Historical Provider, MD  lactulose, encephalopathy, (GENERLAC) 10 GM/15ML SOLN  Take 45 g by mouth daily.    Yes Historical Provider, MD  magnesium hydroxide (MILK OF MAGNESIA) 400 MG/5ML suspension Take 30 mLs by mouth at bedtime.   Yes Historical Provider, MD  Magnesium Oxide (MAG-OX 400 PO) Take 1 tablet by mouth daily.   Yes Historical Provider, MD  Methylnaltrexone Bromide 150 MG TABS Take 3 tablets by mouth 3 (three) times a week. MWF   Yes Historical Provider, MD  Mineral Oil Heavy OIL Place 2 drops into both ears once a week.   Yes Historical Provider, MD  morphine (MS CONTIN) 15 MG 12 hr tablet Take 1 tablet (15 mg total) by mouth every 12 (twelve) hours. Patient taking differently: Take 15 mg by mouth daily.  06/25/15  Yes Rodolph Bong, MD    morphine (MS CONTIN) 15 MG 12 hr tablet Take 15 mg by mouth daily as needed for pain.   Yes Historical Provider, MD  Multiple Vitamin (DAILY VITE PO) Take 1 tablet by mouth daily.   Yes Historical Provider, MD  nystatin cream (MYCOSTATIN) Apply 1 application topically 3 (three) times daily.   Yes Historical Provider, MD  Omega 3 1000 MG CAPS Take 1,000 mg by mouth daily.   Yes Historical Provider, MD  ondansetron (ZOFRAN) 4 MG tablet Take 4 mg by mouth every 6 (six) hours as needed for nausea or vomiting.   Yes Historical Provider, MD  pantoprazole (PROTONIX) 40 MG tablet Take 1 tablet (40 mg total) by mouth daily at 6 (six) AM. 06/25/15  Yes Rodolph Bong, MD  psyllium (REGULOID) 0.52 g capsule Take 1.04 g by mouth daily.   Yes Historical Provider, MD  senna (SENOKOT) 8.6 MG TABS tablet Take 2 tablets by mouth at bedtime.    Yes Historical Provider, MD  sertraline (ZOLOFT) 50 MG tablet Take 50 mg by mouth daily with breakfast.    Yes Historical Provider, MD  simvastatin (ZOCOR) 40 MG tablet Take 40 mg by mouth at bedtime.   Yes Historical Provider, MD  cephALEXin (KEFLEX) 500 MG capsule Take 1 capsule (500 mg total) by mouth 3 (three) times daily. 09/30/16   Azalia Bilis, MD  morphine (MSIR) 15 MG tablet Take 1 tablet (15 mg total) by mouth every 4 (four) hours as needed for severe pain. Patient not taking: Reported on 09/27/2016 06/25/15   Rodolph Bong, MD  polyethylene glycol (GOLYTELY) 236 g solution 8 ounces every 1-2 hours until desired effect Patient not taking: Reported on 09/30/2016 09/27/16   Rolland Porter, MD  polyethylene glycol [facility] Ethelene Hal) packet Take 17 g by mouth 2 (two) times daily. 09/30/16   Azalia Bilis, MD    Family History Family History  Problem Relation Age of Onset  . Heart failure Mother   . Heart failure Father     Social History Social History  Substance Use Topics  . Smoking status: Never Smoker  . Smokeless tobacco: Never Used  . Alcohol use No      Allergies   Patient has no known allergies.   Review of Systems Review of Systems  All other systems reviewed and are negative.    Physical Exam Updated Vital Signs BP (!) 160/110 (BP Location: Left Arm)   Pulse 93   Temp 98.2 F (36.8 C) (Oral)   Resp 20   SpO2 96%   Physical Exam  Constitutional: She is oriented to person, place, and time. She appears well-developed and well-nourished. No distress.  HENT:  Head: Normocephalic and  atraumatic.  Eyes: EOM are normal.  Neck: Normal range of motion.  Cardiovascular: Normal rate, regular rhythm and normal heart sounds.   Pulmonary/Chest: Effort normal and breath sounds normal.  Abdominal:  Abdominal distention with mild tightness.  No peritoneal signs.  No focal tenderness  Musculoskeletal: Normal range of motion.  Neurological: She is alert and oriented to person, place, and time.  Skin: Skin is warm and dry.  Psychiatric: She has a normal mood and affect. Judgment normal.  Nursing note and vitals reviewed.    ED Treatments / Results  Labs (all labs ordered are listed, but only abnormal results are displayed) Labs Reviewed  COMPREHENSIVE METABOLIC PANEL - Abnormal; Notable for the following:       Result Value   Chloride 100 (*)    Glucose, Bld 117 (*)    BUN 31 (*)    Creatinine, Ser 1.14 (*)    Total Protein 8.2 (*)    AST 42 (*)    GFR calc non Af Amer 49 (*)    GFR calc Af Amer 57 (*)    All other components within normal limits  CBC - Abnormal; Notable for the following:    WBC 17.8 (*)    All other components within normal limits  URINALYSIS, ROUTINE W REFLEX MICROSCOPIC - Abnormal; Notable for the following:    APPearance HAZY (*)    Hgb urine dipstick LARGE (*)    Leukocytes, UA TRACE (*)    Bacteria, UA RARE (*)    Squamous Epithelial / LPF 0-5 (*)    All other components within normal limits  URINE CULTURE  LIPASE, BLOOD        EKG  EKG Interpretation None       Radiology Ct  Abdomen Pelvis W Contrast  Result Date: 09/30/2016 CLINICAL DATA:  Patient with pelvic and bladder pain. Abdominal distension. EXAM: CT ABDOMEN AND PELVIS WITH CONTRAST TECHNIQUE: Multidetector CT imaging of the abdomen and pelvis was performed using the standard protocol following bolus administration of intravenous contrast. CONTRAST:  ISOVUE-300 IOPAMIDOL (ISOVUE-300) INJECTION 61% COMPARISON:  Chest CT 03/18/2015; chest radiograph 09/27/2016 FINDINGS: Lower chest: Normal heart size. Right lower hemithorax pleural thickening. Minimal atelectasis and/or scarring left lung base. In the left lower lobe there is a 5 x 4 mm nodule (5 mm mean diameter) on image 23 of series 4. Hepatobiliary: Liver is normal in size and contour. Gallbladder is unremarkable. Mild intrahepatic and extrahepatic biliary ductal dilatation. Pancreas: Unremarkable Spleen: Unremarkable Adrenals/Urinary Tract: Adrenal glands are normal. The kidneys enhance symmetrically with contrast. Note is made of a 1.9 cm simple cyst within the inferior pole of the left kidney. The urinary bladder is markedly thick walled with surrounding fat stranding compatible with cystitis. Foley catheter present within the urinary bladder. There is enhancement of the urothelium of the ureters bilaterally to the level of the bilateral renal collecting systems Stomach/Bowel: Circumferential wall thickening of the rectum. Large amount of stool within the ascending, transverse and proximal descending colon. Small hiatal hernia. No evidence for small bowel obstruction. No free fluid or free intraperitoneal air. Normal appendix. Vascular/Lymphatic: Normal caliber abdominal aorta with peripheral calcified atherosclerotic plaque. No retroperitoneal lymphadenopathy. 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 Reproductive: Uterus is heterogeneously enhancing. Other: Catheter is demonstrated coursing from the thoracic spine around the left flank and  towards the anterior abdomen. Musculoskeletal: Lumbar spine degenerative changes. No aggressive or acute appearing osseous lesions. IMPRESSION: Marked wall thickening and heterogeneous  enhancement of the urinary bladder most compatible with cystitis. There is enhancement of the urothelium of the ureters bilaterally to the level of the renal collecting systems compatible with ascending urinary tract infection. Large amount of stool throughout the colon as can be seen with constipation. Additionally there is wall thickening of the rectum which is nonspecific however proctocolitis is not excluded. Cavernous transformation of the portal vein. Mild central intrahepatic biliary ductal dilatation. Recommend correlation with LFTs. Left lower lobe pulmonary nodule measuring 5 mm. No follow-up needed if patient is low-risk. Non-contrast chest CT can be considered in 12 months if patient is high-risk. This recommendation follows the consensus statement: Guidelines for Management of Incidental Pulmonary Nodules Detected on CT Images: From the Fleischner Society 2017; Radiology 2017; 284:228-243. Nonspecific pleural thickening right lower hemithorax. Aortic atherosclerosis. Electronically Signed   By: Annia Belt M.D.   On: 09/30/2016 07:48    Procedures Procedures (including critical care time)  Medications Ordered in ED Medications  morphine 4 MG/ML injection 6 mg (6 mg Intravenous Given 09/30/16 0555)  iopamidol (ISOVUE-300) 61 % injection 100 mL (100 mLs Intravenous Contrast Given 09/30/16 0706)  cephALEXin (KEFLEX) capsule 500 mg (500 mg Oral Given 09/30/16 0908)     Initial Impression / Assessment and Plan / ED Course  I have reviewed the triage vital signs and the nursing notes.  Pertinent labs & imaging results that were available during my care of the patient were reviewed by me and considered in my medical decision making (see chart for details).     Nonspecific findings on CT of the abdomen and  pelvis.  Patient is now aware of the pulmonary nodule and the need to follow this up with her primary care team.  She understands she will need noncontrasted CT scan in 6 months.  Her abdominal pain significantly improved once her Foley catheter was replaced.  Initially with flushing 600 cc of urine came out.  I suspect she has some degree of urinary retention.  I'll increase her Keflex from twice a day to 3 times a day.  I suspect now that her bladder is fully drained she will clear her infection better.  I do not think she is additional testing or admission the hospital this time.  Discharge home in good condition.  Patient understands return to the ER for new or worsening symptoms  Final Clinical Impressions(s) / ED Diagnoses   Final diagnoses:  Generalized abdominal pain  Urinary catheter dysfunction, initial encounter (HCC)  Constipation, unspecified constipation type    New Prescriptions Discharge Medication List as of 09/30/2016  8:21 AM       Azalia Bilis, MD 10/01/16 [id number]

## 2016-10-02 ENCOUNTER — Telehealth: Payer: Self-pay | Admitting: *Deleted

## 2016-10-02 LAB — URINE CULTURE: Culture: <10000 — AB

## 2016-10-02 NOTE — Progress Notes (Signed)
ED Antimicrobial Stewardship Positive Culture Follow Up   Deanna Morgan is an 66 y.o. female who presented to [facility] on 09/30/2016 with a chief complaint of  Chief Complaint  Patient presents with  . Abdominal Pain    Recent Results (from the past 720 hour(s))  Urine culture     Status: Abnormal   Collection Time: 09/27/16  8:46 PM  Result Value Ref Range Status   Specimen Description URINE, RANDOM  Final   Special Requests NONE  Final   Culture (A)  Final    >=100,000 COLONIES/mL ESCHERICHIA COLI >=100,000 COLONIES/mL ENTEROCOCCUS FAECALIS    Report Status 10/01/2016 FINAL  Final   Organism ID, Bacteria ESCHERICHIA COLI (A)  Final   Organism ID, Bacteria ENTEROCOCCUS FAECALIS (A)  Final      Susceptibility   Escherichia coli - MIC*    AMPICILLIN >=32 RESISTANT Resistant     CEFAZOLIN <=4 SENSITIVE Sensitive     CEFTRIAXONE <=1 SENSITIVE Sensitive     CIPROFLOXACIN >=4 RESISTANT Resistant     GENTAMICIN <=1 SENSITIVE Sensitive     IMIPENEM <=0.25 SENSITIVE Sensitive     NITROFURANTOIN <=16 SENSITIVE Sensitive     TRIMETH/SULFA >=320 RESISTANT Resistant     AMPICILLIN/SULBACTAM 8 SENSITIVE Sensitive     PIP/TAZO <=4 SENSITIVE Sensitive     Extended ESBL NEGATIVE Sensitive     * >=100,000 COLONIES/mL ESCHERICHIA COLI   Enterococcus faecalis - MIC*    AMPICILLIN <=2 SENSITIVE Sensitive     LEVOFLOXACIN >=8 RESISTANT Resistant     NITROFURANTOIN <=16 SENSITIVE Sensitive     VANCOMYCIN 1 SENSITIVE Sensitive     * >=100,000 COLONIES/mL ENTEROCOCCUS FAECALIS    65 YOF with chronic indwelling foley who presented on 3/22 and 3/25 with abdominal distention, decreased UOP, suprapubic pain - improved with foley exchange. The patient was given Keflex on 3/22 with repeat UA on 3/25 showing improvement. Afeb, WBC 17.8 on 3/25  Discussed with PA-C - no further treatment indicated at this time.  ED Provider: Renne Crigler, PA-C  [name] 10/02/2016, 8:53  AM Infectious Diseases Pharmacist Phone# [phone]

## 2016-10-02 NOTE — Telephone Encounter (Signed)
Post ED Visit - Positive Culture Follow-up  Culture report reviewed by antimicrobial stewardship pharmacist:  []  Enzo Bi, Pharm.D. []  Celedonio Miyamoto, [address].D., BCPS AQ-ID []  Garvin Fila, Pharm.D., BCPS [x]  Georgina Pillion, Pharm.D., BCPS []  [place], [address].D., BCPS, AAHIVP []  Estella Husk, Pharm.D., BCPS, AAHIVP []  Lysle Pearl, PharmD, BCPS []  Casilda Carls, PharmD, BCPS []  Pollyann Samples, PharmD, BCPS  Positive urine culture Treated with Cephalexin, organism sensitive to the same and no further patient follow-up is required at this time.  Virl Axe [facility] 10/02/2016, 12:58 PM

## 2020-04-21 ENCOUNTER — Encounter (HOSPITAL_COMMUNITY): Payer: Self-pay | Admitting: Emergency Medicine

## 2020-04-21 ENCOUNTER — Inpatient Hospital Stay (HOSPITAL_COMMUNITY)
Admission: EM | Admit: 2020-04-21 | Discharge: 2020-04-29 | DRG: 602 | Disposition: A | Discharge status: Nursing Home (Includes ICF) | Payer: Medicare Other | Source: Skilled Nursing Facility | Attending: Internal Medicine | Admitting: Internal Medicine

## 2020-04-21 ENCOUNTER — Other Ambulatory Visit: Payer: Self-pay

## 2020-04-21 DIAGNOSIS — E785 Hyperlipidemia, unspecified: Secondary | ICD-10-CM | POA: Diagnosis present

## 2020-04-21 DIAGNOSIS — E876 Hypokalemia: Secondary | ICD-10-CM | POA: Diagnosis present

## 2020-04-21 DIAGNOSIS — Z7982 Long term (current) use of aspirin: Secondary | ICD-10-CM | POA: Diagnosis not present

## 2020-04-21 DIAGNOSIS — Z993 Dependence on wheelchair: Secondary | ICD-10-CM

## 2020-04-21 DIAGNOSIS — G894 Chronic pain syndrome: Secondary | ICD-10-CM | POA: Diagnosis present

## 2020-04-21 DIAGNOSIS — R5381 Other malaise: Secondary | ICD-10-CM | POA: Diagnosis present

## 2020-04-21 DIAGNOSIS — G8250 Quadriplegia, unspecified: Secondary | ICD-10-CM | POA: Diagnosis present

## 2020-04-21 DIAGNOSIS — G950 Syringomyelia and syringobulbia: Secondary | ICD-10-CM | POA: Diagnosis present

## 2020-04-21 DIAGNOSIS — L0390 Cellulitis, unspecified: Secondary | ICD-10-CM | POA: Diagnosis present

## 2020-04-21 DIAGNOSIS — N390 Urinary tract infection, site not specified: Secondary | ICD-10-CM | POA: Diagnosis present

## 2020-04-21 DIAGNOSIS — Z66 Do not resuscitate: Secondary | ICD-10-CM | POA: Diagnosis present

## 2020-04-21 DIAGNOSIS — R140 Abdominal distension (gaseous): Secondary | ICD-10-CM | POA: Diagnosis not present

## 2020-04-21 DIAGNOSIS — L03114 Cellulitis of left upper limb: Secondary | ICD-10-CM | POA: Diagnosis present

## 2020-04-21 DIAGNOSIS — I739 Peripheral vascular disease, unspecified: Secondary | ICD-10-CM | POA: Diagnosis present

## 2020-04-21 DIAGNOSIS — K567 Ileus, unspecified: Secondary | ICD-10-CM

## 2020-04-21 DIAGNOSIS — Z20822 Contact with and (suspected) exposure to covid-19: Secondary | ICD-10-CM | POA: Diagnosis present

## 2020-04-21 DIAGNOSIS — Z8249 Family history of ischemic heart disease and other diseases of the circulatory system: Secondary | ICD-10-CM

## 2020-04-21 DIAGNOSIS — D649 Anemia, unspecified: Secondary | ICD-10-CM | POA: Diagnosis present

## 2020-04-21 DIAGNOSIS — N3289 Other specified disorders of bladder: Secondary | ICD-10-CM | POA: Diagnosis present

## 2020-04-21 DIAGNOSIS — I5032 Chronic diastolic (congestive) heart failure: Secondary | ICD-10-CM | POA: Diagnosis present

## 2020-04-21 DIAGNOSIS — I10 Essential (primary) hypertension: Secondary | ICD-10-CM | POA: Diagnosis present

## 2020-04-21 DIAGNOSIS — K219 Gastro-esophageal reflux disease without esophagitis: Secondary | ICD-10-CM | POA: Diagnosis present

## 2020-04-21 DIAGNOSIS — K5900 Constipation, unspecified: Secondary | ICD-10-CM | POA: Diagnosis not present

## 2020-04-21 DIAGNOSIS — I252 Old myocardial infarction: Secondary | ICD-10-CM

## 2020-04-21 DIAGNOSIS — R21 Rash and other nonspecific skin eruption: Secondary | ICD-10-CM | POA: Diagnosis present

## 2020-04-21 DIAGNOSIS — M7022 Olecranon bursitis, left elbow: Secondary | ICD-10-CM | POA: Diagnosis present

## 2020-04-21 DIAGNOSIS — I110 Hypertensive heart disease with heart failure: Secondary | ICD-10-CM | POA: Diagnosis present

## 2020-04-21 DIAGNOSIS — Z79899 Other long term (current) drug therapy: Secondary | ICD-10-CM | POA: Diagnosis not present

## 2020-04-21 LAB — BASIC METABOLIC PANEL
Anion gap: 9 (ref 5–15)
BUN: 19 mg/dL (ref 8–23)
CO2: 27 mmol/L (ref 22–32)
Calcium: 8.7 mg/dL — ABNORMAL LOW (ref 8.9–10.3)
Chloride: 100 mmol/L (ref 98–111)
Creatinine, Ser: 0.65 mg/dL (ref 0.44–1.00)
GFR, Estimated: >60 mL/min (ref >60)
Glucose, Bld: 126 mg/dL — ABNORMAL HIGH (ref 70–99)
Potassium: 4.2 mmol/L (ref 3.5–5.1)
Sodium: 136 mmol/L (ref 135–145)

## 2020-04-21 LAB — CBC WITH DIFFERENTIAL/PLATELET
Abs Immature Granulocytes: 0.01 10*3/uL (ref 0.00–0.07)
Basophils Absolute: 0 10*3/uL (ref 0.0–0.1)
Basophils Relative: 0 %
Eosinophils Absolute: 0.1 10*3/uL (ref 0.0–0.5)
Eosinophils Relative: 2 %
HCT: 37.7 % (ref 36.0–46.0)
Hemoglobin: 11.9 g/dL — ABNORMAL LOW (ref 12.0–15.0)
Immature Granulocytes: 0 %
Lymphocytes Relative: 15 %
Lymphs Abs: 0.9 10*3/uL (ref 0.7–4.0)
MCH: 28.8 pg (ref 26.0–34.0)
MCHC: 31.6 g/dL (ref 30.0–36.0)
MCV: 91.3 fL (ref 80.0–100.0)
Monocytes Absolute: 0.6 10*3/uL (ref 0.1–1.0)
Monocytes Relative: 10 %
Neutro Abs: 4.5 10*3/uL (ref 1.7–7.7)
Neutrophils Relative %: 73 %
Platelets: 205 10*3/uL (ref 150–400)
RBC: 4.13 MIL/uL (ref 3.87–5.11)
RDW: 13.4 % (ref 11.5–15.5)
WBC: 6.2 10*3/uL (ref 4.0–10.5)
nRBC: 0 % (ref 0.0–0.2)

## 2020-04-21 LAB — LACTIC ACID, PLASMA: Lactic Acid, Venous: 1 mmol/L (ref 0.5–1.9)

## 2020-04-21 MED ORDER — SODIUM CHLORIDE 0.9 % IV SOLN
1.0000 g | Freq: Once | INTRAVENOUS | Status: AC
  Administered 2020-04-21: 1 g via INTRAVENOUS
  Filled 2020-04-21: qty 10

## 2020-04-21 MED ORDER — ACETAMINOPHEN 325 MG PO TABS
650.0000 mg | ORAL_TABLET | Freq: Four times a day (QID) | ORAL | Status: DC | PRN
  Administered 2020-04-22 – 2020-04-29 (×4): 650 mg via ORAL
  Filled 2020-04-21 (×4): qty 2

## 2020-04-21 MED ORDER — SODIUM CHLORIDE 0.9 % IV SOLN
2.0000 g | Freq: Three times a day (TID) | INTRAVENOUS | Status: DC
  Administered 2020-04-22: 2 g via INTRAVENOUS
  Filled 2020-04-21 (×2): qty 2

## 2020-04-21 MED ORDER — VANCOMYCIN HCL 1500 MG/300ML IV SOLN
1500.0000 mg | Freq: Once | INTRAVENOUS | Status: AC
  Administered 2020-04-21: 1500 mg via INTRAVENOUS
  Filled 2020-04-21: qty 300

## 2020-04-21 MED ORDER — SODIUM CHLORIDE 0.9 % IV SOLN
2.0000 g | Freq: Once | INTRAVENOUS | Status: AC
  Administered 2020-04-21: 2 g via INTRAVENOUS
  Filled 2020-04-21: qty 2

## 2020-04-21 MED ORDER — VANCOMYCIN HCL IN DEXTROSE 1-5 GM/200ML-% IV SOLN
1000.0000 mg | Freq: Two times a day (BID) | INTRAVENOUS | Status: DC
  Administered 2020-04-22: 1000 mg via INTRAVENOUS
  Filled 2020-04-21: qty 200

## 2020-04-21 MED ORDER — DOCUSATE SODIUM 100 MG PO CAPS
100.0000 mg | ORAL_CAPSULE | Freq: Two times a day (BID) | ORAL | Status: DC
  Administered 2020-04-22 – 2020-04-29 (×15): 100 mg via ORAL
  Filled 2020-04-21 (×15): qty 1

## 2020-04-21 MED ORDER — ASPIRIN 81 MG PO CHEW
81.0000 mg | CHEWABLE_TABLET | Freq: Every day | ORAL | Status: DC
  Administered 2020-04-22 – 2020-04-29 (×8): 81 mg via ORAL
  Filled 2020-04-21 (×8): qty 1

## 2020-04-21 MED ORDER — MORPHINE SULFATE ER 15 MG PO TBCR
15.0000 mg | EXTENDED_RELEASE_TABLET | Freq: Every day | ORAL | Status: DC
  Administered 2020-04-22: 15 mg via ORAL
  Filled 2020-04-21: qty 1

## 2020-04-21 MED ORDER — ACETAMINOPHEN 650 MG RE SUPP: 650.0000 mg | Freq: Four times a day (QID) | RECTAL | Status: DC | PRN

## 2020-04-21 NOTE — ED Notes (Addendum)
EDP Charm Barges, MD. called and consulted with Ortho.

## 2020-04-21 NOTE — Progress Notes (Signed)
Pharmacy Antibiotic Note  Deanna Morgan is a 69 y.o. female admitted on 04/21/2020 with cellulitis.  Ceftriaxone 1gm IV x 1 given in the ED.  Upon admission, Pharmacy has been consulted for Vancomycin and Cefepime dosing.  Plan:  Vancomycin 1500mg  IV x 1 followed by 1gm IV q12h  Cefepime 2gm IV q8h  Follow renal function  Follow culture results/sensitivities  Monitor vancomycin trough level as needed  Height: 5\' 3"  (160 cm) Weight: 70.3 kg (155 lb) IBW/kg (Calculated) : 52.4  Temp (24hrs), Avg:98.7 F (37.1 C), Min:98.7 F (37.1 C), Max:98.7 F (37.1 C)  Recent Labs  Lab 04/21/20 1903  WBC 6.2  CREATININE 0.65  LATICACIDVEN 1.0    Estimated Creatinine Clearance: 61.6 mL/min (by C-G formula based on SCr of 0.65 mg/dL).    No Known Allergies  Antimicrobials this admission: 10/14 Ceftriaxone x 1 10/14 Vanc >>   10/14 Cefepime >>  Dose adjustments this admission:    Microbiology results: 10/14 BCx:    Thank you for allowing pharmacy to be a part of this patient's care.  11/14, PharmD 04/21/2020 10:04 PM

## 2020-04-21 NOTE — ED Triage Notes (Signed)
Pt arrives via EMS from [facility]. Cellulitis to left arm, has been going on x 4 days. Pt currently taking keflex started 2 days ago. Pt's symptoms have worsened. Arm is hot, red, and swollen. Pt denies any other pain.   EMS Vitals:  BP 168/88 HR 90 R 18 SPO2 96 %

## 2020-04-21 NOTE — H&P (Addendum)
History and Physical    Ura Yingling WPY:[id number] DOB: 02-11-1951 DOA: 04/21/2020  PCP: Florentina Jenny, MD   Patient coming from: Assisted living facility.  Chief Complaint: Left upper extremity redness and swelling.  HPI: Deanna Morgan is a 69 y.o. 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  Was started on Keflex by patient's primary care physician about 3 days ago.  Patient states she has been rubbing her elbow on a chair which she states and noticed swelling around the left elbow and started getting red which started to spread proximally and distally.  Despite taking oral antibiotics patient swelling has got worse and presents to the ER.  ED Course: In the ER patient was afebrile on exam patient does have swelling around the olecranon bursa with redness of the arm and forearm on the left side.  ER physician had discussed with on-call orthopedic surgeon Dr. Jena Gauss who advised at this time IV antibiotics and Ace wrap and follow-up with orthopedics as outpatient in 2 weeks after antibiotics.  Given that patient has failed oral antibiotics and cellulitis is getting worse patient admitted for IV antibiotics.  Labs show hemoglobin of 11.9 Covid test negative.  Since patient has been having some bloating sensation in the abdomen x-ray of the abdomen was done which does not show anything acute.  Review of Systems: As per HPI, rest all negative.   Past Medical History:  Diagnosis Date  . Anxiety   . 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 CHF (congestive heart failure) (HCC)   . Chronic constipation   . Chronic pain   . Chronic UTI   . GERD (gastroesophageal reflux disease)   . Hyperlipidemia   . Hypertension   . Neuromuscular disorder (HCC)   .  Paralysis (HCC)   . Peripheral vascular disease (HCC)   . Shortness of breath dyspnea   . Syringomyelia [facility])     Past Surgical History:  Procedure Laterality Date  . NECK SURGERY       reports that she has never smoked. She has never used smokeless tobacco. She reports that she does not drink alcohol and does not use drugs.  No Known Allergies  Family History  Problem Relation Age of Onset  . Heart failure Mother   . Heart failure Father     Prior to Admission medications   Medication Sig Start Date End Date Taking? Authorizing Provider  acetaminophen (TYLENOL) 500 MG tablet Take 500 mg by mouth every 6 (six) hours as needed for moderate pain.    [provider]  acidophilus (RISAQUAD) CAPS capsule Take 1 capsule by mouth 2 (two) times daily.    [provider]  aspirin 81 MG tablet Take 81 mg by mouth daily.    [provider]  baclofen (LIORESAL) 20 MG tablet Take 20 mg by mouth 2 (two) times daily.    [provider]  bisacodyl (LAXATIVE) 10 MG suppository Place 10 mg rectally 3 (three) times a week. MWF    [provider]  cephALEXin (KEFLEX) 500 MG capsule Take 1 capsule (500 mg total) by mouth 3 (three) times daily. 09/30/16   Azalia Bilis, MD  Cranberry 425 MG CAPS Take 425 mg by mouth 2 (two) times daily.    [provider]  docusate  sodium (COLACE) 100 MG capsule Take 100 mg by mouth 2 (two) times daily.    [provider]  fesoterodine (TOVIAZ) 8 MG TB24 tablet Take 8 mg by mouth daily.    [provider]  lactulose, encephalopathy, (GENERLAC) 10 GM/15ML SOLN Take 45 g by mouth daily.     [provider]  magnesium hydroxide (MILK OF MAGNESIA) 400 MG/5ML suspension Take 30 mLs by mouth at bedtime.    [provider]  Magnesium Oxide (MAG-OX 400 PO) Take 1 tablet by mouth daily.    [provider]  Methylnaltrexone Bromide 150 MG TABS Take 3 tablets by mouth 3 (three) times  a week. MWF    [provider]  Mineral Oil Heavy OIL Place 2 drops into both ears once a week.    [provider]  morphine (MS CONTIN) 15 MG 12 hr tablet Take 1 tablet (15 mg total) by mouth every 12 (twelve) hours. Patient taking differently: Take 15 mg by mouth daily.  06/25/15   Rodolph Bong, MD  morphine (MS CONTIN) 15 MG 12 hr tablet Take 15 mg by mouth daily as needed for pain.    [provider]  morphine (MSIR) 15 MG tablet Take 1 tablet (15 mg total) by mouth every 4 (four) hours as needed for severe pain. Patient not taking: Reported on 09/27/2016 06/25/15   Rodolph Bong, MD  Multiple Vitamin (DAILY VITE PO) Take 1 tablet by mouth daily.    [provider]  nystatin cream (MYCOSTATIN) Apply 1 application topically 3 (three) times daily.    [provider]  Omega 3 1000 MG CAPS Take 1,000 mg by mouth daily.    [provider]  ondansetron (ZOFRAN) 4 MG tablet Take 4 mg by mouth every 6 (six) hours as needed for nausea or vomiting.    [provider]  pantoprazole (PROTONIX) 40 MG tablet Take 1 tablet (40 mg total) by mouth daily at 6 (six) AM. 06/25/15   Rodolph Bong, MD  polyethylene glycol (GOLYTELY) 236 g solution 8 ounces every 1-2 hours until desired effect Patient not taking: Reported on 09/30/2016 09/27/16   Rolland Porter, MD  polyethylene glycol (MIRALAX / Ethelene Hal) packet Take 17 g by mouth 2 (two) times daily. 09/30/16   Azalia Bilis, MD  psyllium (REGULOID) 0.52 g capsule Take 1.04 g by mouth daily.    [provider]  senna (SENOKOT) 8.6 MG TABS tablet Take 2 tablets by mouth at bedtime.     [provider]  sertraline (ZOLOFT) 50 MG tablet Take 50 mg by mouth daily with breakfast.     [provider]  simvastatin (ZOCOR) 40 MG tablet Take 40 mg by mouth at bedtime.    [provider]    Physical Exam: Constitutional: Moderately built and nourished. Vitals:    04/21/20 1903 04/21/20 1914 04/21/20 2121 04/21/20 2145  BP: (!) 144/96  (!) 184/83 (!) 161/109  Pulse: 69  72 69  Resp: 17  19 18   Temp: 98.7 F (37.1 C)     TempSrc: Oral     SpO2: 95%  97% 96%  Weight:  70.3 kg    Height:  5\' 3"  (1.6 m)     Eyes: Anicteric no pallor. ENMT: No discharge from the ears eyes nose or mouth. Neck: No mass felt.  No neck rigidity. Respiratory: No rhonchi or crepitations. Cardiovascular: S1-S2 heard. Abdomen: Soft mildly distended nontender bowel sounds present. Musculoskeletal: No edema.  Swelling of the left olecranon bursa. Skin: Left upper extremity is erythematous from the left arm and forearm. Neurologic: Alert awake oriented to time place and person.  Has quadriparesis from syringomyelia. Psychiatric: Appears normal.  Normal affect.   Labs on Admission: I have personally reviewed following labs and imaging studies  CBC: Recent Labs  Lab 04/21/20 1903  WBC 6.2  NEUTROABS 4.5  HGB 11.9*  HCT 37.7  MCV 91.3  PLT 205   Basic Metabolic Panel: Recent Labs  Lab 04/21/20 1903  NA 136  K 4.2  CL 100  CO2 27  GLUCOSE 126*  BUN 19  CREATININE 0.65  CALCIUM 8.7*   GFR: Estimated Creatinine Clearance: 61.6 mL/min (by C-G formula based on SCr of 0.65 mg/dL). Liver Function Tests: No results for input(s): AST, ALT, ALKPHOS, BILITOT, PROT, ALBUMIN in the last 168 hours. No results for input(s): LIPASE, AMYLASE in the last 168 hours. No results for input(s): AMMONIA in the last 168 hours. Coagulation Profile: No results for input(s): INR, PROTIME in the last 168 hours. Cardiac Enzymes: No results for input(s): CKTOTAL, CKMB, CKMBINDEX, TROPONINI in the last 168 hours. BNP (last 3 results) No results for input(s): PROBNP in the last 8760 hours. HbA1C: No results for input(s): HGBA1C in the last 72 hours. CBG: No results for input(s): GLUCAP in the last 168 hours. Lipid Profile: No results for input(s): CHOL, HDL, LDLCALC, TRIG,  CHOLHDL, LDLDIRECT in the last 72 hours. Thyroid Function Tests: No results for input(s): TSH, T4TOTAL, FREET4, T3FREE, THYROIDAB in the last 72 hours. Anemia Panel: No results for input(s): VITAMINB12, FOLATE, FERRITIN, TIBC, IRON, RETICCTPCT in the last 72 hours. Urine analysis:    Component Value Date/Time   COLORURINE YELLOW 09/30/2016 0644   APPEARANCEUR HAZY (A) 09/30/2016 0644   LABSPEC 1.006 09/30/2016 0644   PHURINE 5.0 09/30/2016 0644   GLUCOSEU NEGATIVE 09/30/2016 0644   HGBUR LARGE (A) 09/30/2016 0644   BILIRUBINUR NEGATIVE 09/30/2016 0644   KETONESUR NEGATIVE 09/30/2016 0644   PROTEINUR NEGATIVE 09/30/2016 0644   UROBILINOGEN 0.2 03/14/2015 0703   NITRITE NEGATIVE 09/30/2016 0644   LEUKOCYTESUR TRACE (A) 09/30/2016 0644   Sepsis Labs: @LABRCNTIP (procalcitonin:4,lacticidven:4) )No results found for this or any previous visit (from the past 240 hour(s)).   Radiological Exams on Admission: No results found.    Assessment/Plan Principal Problem:   Cellulitis of left upper extremity Active Problems:   Quadriparesis (HCC)   Syringomyelia (HCC)   HLD (hyperlipidemia)   Cellulitis    1. Cellulitis of the left upper extremity with olecranon bursitis -ER physician had discussed with on-call orthopedic surgeon Dr. Jena Gauss who advised IV antibiotics and Ace wrap and follow-up in 2 weeks with orthopedics.  Will admit for IV antibiotics and continue to monitor. 2. 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  Blood pressure runs high when she is stressed.  Patient blood pressure at times are in the 200s systolic.  We will keep patient on as needed IV hydralazine for now. 3. History of chronic pain on MS Contin. 4. History of quadriparesis secondary to syringomyelia. 5. History of hyperlipidemia. 6. Anemia -follow CBC.  Patient's home medication needs to be verified and ordered accordingly.  Since  patient has significant swelling and redness of the left upper extremity and failed oral antibiotics as outpatient will need close monitoring.   DVT prophylaxis: Heparin. Code Status: DNR. Family Communication: Discussed with patient. Disposition Plan: Back to facility when stable.  Consults called: ER physician discussed with orthopedic surgeon Dr. Jena Gauss. Admission status: Inpatient.   Eduard Clos MD [facility] Pager [phone].  If 7PM-7AM, please contact night-coverage [email or web address] Password [email or web address]  04/21/2020, 9:58 PM

## 2020-04-21 NOTE — ED Provider Notes (Signed)
[facility]-EMERGENCY DEPT Provider Note   CSN: [id number] Arrival date & time: 04/21/20  1840     History No chief complaint on file.   Deanna Morgan is a 69 y.o. female.  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  She is complaining of some swelling and redness to her left arm that is been going on about 4 days.  She does not recall any trauma but she does lean on that arm a lot.  The primary care doctor at the facility saw her a day or 2 ago and started her on Keflex.  She said despite that the arm is gotten worse and the redness is extending up over her upper arm now.  No fever that she knows of no chills or nausea.  Good appetite.  She is also concerned because she had a resistant UTI a few weeks ago that it was recommended she get IV antibiotics for her and she never did.  Sometimes has urine symptoms.  Just saw [facility] last week and had a catheter change.  The history is provided by the patient.  Rash Location:  Shoulder/arm Shoulder/arm rash location:  L upper arm, L elbow and L forearm Quality: painful and redness   Pain details:    Quality:  Aching   Onset quality:  Gradual   Duration:  5 days   Timing:  Constant   Progression:  Worsening Chronicity:  New Relieved by:  Nothing Worsened by:  Nothing Ineffective treatments: oral abx. Associated symptoms: no abdominal pain, no fatigue, no fever, no headaches, no nausea, no shortness of breath, no sore throat and not vomiting        Past Medical History:  Diagnosis Date  . Anxiety   . 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 CHF (congestive heart failure) (HCC)   . Chronic constipation   . Chronic pain   . Chronic UTI   . GERD (gastroesophageal reflux disease)   . Hyperlipidemia   . Hypertension   . Neuromuscular disorder (HCC)   . Paralysis (HCC)   . Peripheral vascular disease (HCC)   . Shortness of breath dyspnea   . Syringomyelia [facility])     Patient Active Problem  List   Diagnosis Date Noted  . 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 UTI (lower urinary tract infection) 06/22/2015  . Sepsis (HCC) 06/22/2015  . 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 Depression with anxiety 06/22/2015  . HLD (hyperlipidemia) 06/22/2015  . Slurred speech 06/22/2015  . NSTEMI (non-ST elevated myocardial infarction) (HCC)   . Adynamic ileus (HCC)   . Constipation   . Quadriparesis (HCC)   . Pressure ulcer 03/19/2015  . HCAP (healthcare-associated pneumonia)   . Chronic diastolic (congestive) heart failure (HCC)   . CN (constipation)   . Acute respiratory failure with hypoxemia (HCC)   . Aspiration pneumonia (HCC) 03/14/2015  . Abdominal pain, acute   . Arterial hypotension   . Hypoxia   . Ileus (HCC)   . Infection due to Port-A-Cath     Past Surgical History:  Procedure Laterality Date  . NECK SURGERY       OB History   No obstetric history on file.     Family History  Problem Relation Age of Onset  . Heart failure Mother   . Heart failure Father     Social History   Tobacco Use  . Smoking status: Never Smoker  . Smokeless tobacco: Never Used  Substance Use Topics  .  Alcohol use: No    Alcohol/week: 0.0 standard drinks  . Drug use: No    Types: Morphine    Home Medications Prior to Admission medications   Medication Sig Start Date End Date Taking? Authorizing Provider  acetaminophen (TYLENOL) 500 MG tablet Take 500 mg by mouth every 6 (six) hours as needed for moderate pain.    [provider]  acidophilus (RISAQUAD) CAPS capsule Take 1 capsule by mouth 2 (two) times daily.    [provider]  aspirin 81 MG tablet Take 81 mg by mouth daily.    [provider]  baclofen (LIORESAL) 20 MG tablet Take 20 mg by mouth 2 (two) times daily.    [provider]  bisacodyl (LAXATIVE) 10 MG suppository Place 10 mg rectally 3 (three) times a week. MWF    [provider]  cephALEXin (KEFLEX) 500 MG capsule  Take 1 capsule (500 mg total) by mouth 3 (three) times daily. 09/30/16   Azalia Bilis, MD  Cranberry 425 MG CAPS Take 425 mg by mouth 2 (two) times daily.    [provider]  docusate sodium (COLACE) 100 MG capsule Take 100 mg by mouth 2 (two) times daily.    [provider]  fesoterodine (TOVIAZ) 8 MG TB24 tablet Take 8 mg by mouth daily.    [provider]  lactulose, encephalopathy, (GENERLAC) 10 GM/15ML SOLN Take 45 g by mouth daily.     [provider]  magnesium hydroxide (MILK OF MAGNESIA) 400 MG/5ML suspension Take 30 mLs by mouth at bedtime.    [provider]  Magnesium Oxide (MAG-OX 400 PO) Take 1 tablet by mouth daily.    [provider]  Methylnaltrexone Bromide 150 MG TABS Take 3 tablets by mouth 3 (three) times a week. MWF    [provider]  Mineral Oil Heavy OIL Place 2 drops into both ears once a week.    [provider]  morphine (MS CONTIN) 15 MG 12 hr tablet Take 1 tablet (15 mg total) by mouth every 12 (twelve) hours. Patient taking differently: Take 15 mg by mouth daily.  06/25/15   Rodolph Bong, MD  morphine (MS CONTIN) 15 MG 12 hr tablet Take 15 mg by mouth daily as needed for pain.    [provider]  morphine (MSIR) 15 MG tablet Take 1 tablet (15 mg total) by mouth every 4 (four) hours as needed for severe pain. Patient not taking: Reported on 09/27/2016 06/25/15   Rodolph Bong, MD  Multiple Vitamin (DAILY VITE PO) Take 1 tablet by mouth daily.    [provider]  nystatin cream (MYCOSTATIN) Apply 1 application topically 3 (three) times daily.    [provider]  Omega 3 1000 MG CAPS Take 1,000 mg by mouth daily.    [provider]  ondansetron (ZOFRAN) 4 MG tablet Take 4 mg by mouth every 6 (six) hours as needed for nausea or vomiting.    [provider]  pantoprazole (PROTONIX) 40 MG tablet Take 1 tablet (40 mg total) by mouth daily at 6 (six)  AM. 06/25/15   Rodolph Bong, MD  polyethylene glycol (GOLYTELY) 236 g solution 8 ounces every 1-2 hours until desired effect Patient not taking: Reported on 09/30/2016 09/27/16   Rolland Porter, MD  polyethylene glycol (MIRALAX / Ethelene Hal) packet Take 17 g by mouth 2 (two) times daily. 09/30/16   Azalia Bilis, MD  psyllium (REGULOID) 0.52 g capsule Take 1.04 g  by mouth daily.    [provider]  senna (SENOKOT) 8.6 MG TABS tablet Take 2 tablets by mouth at bedtime.     [provider]  sertraline (ZOLOFT) 50 MG tablet Take 50 mg by mouth daily with breakfast.     [provider]  simvastatin (ZOCOR) 40 MG tablet Take 40 mg by mouth at bedtime.    [provider]    Allergies    Patient has no known allergies.  Review of Systems   Review of Systems  Constitutional: Negative for fatigue and fever.  HENT: Negative for sore throat.   Eyes: Negative for visual disturbance.  Respiratory: Negative for shortness of breath.   Cardiovascular: Negative for chest pain.  Gastrointestinal: Negative for abdominal pain, nausea and vomiting.  Genitourinary: Positive for dysuria (sometimes).  Musculoskeletal: Positive for gait problem.  Skin: Positive for rash.  Neurological: Negative for headaches.    Physical Exam Updated Vital Signs BP (!) 144/96 (BP Location: Right Arm)   Pulse 69   Temp 98.7 F (37.1 C) (Oral)   Resp 17   Ht 5\' 3"  (1.6 m)   Wt 70.3 kg   SpO2 95%   BMI 27.46 kg/m   Physical Exam Vitals and nursing note reviewed.  Constitutional:      General: She is not in acute distress.    Appearance: Normal appearance. She is well-developed.  HENT:     Head: Normocephalic and atraumatic.  Eyes:     Conjunctiva/sclera: Conjunctivae normal.     Comments: Lazy eye  Cardiovascular:     Rate and Rhythm: Normal rate and regular rhythm.     Heart sounds: No murmur heard.   Pulmonary:     Effort: Pulmonary effort is normal. No respiratory  distress.     Breath sounds: Normal breath sounds.  Abdominal:     General: There is distension.     Palpations: Abdomen is soft.     Tenderness: There is no abdominal tenderness.  Musculoskeletal:     Cervical back: Neck supple.     Comments: Left arm swollen and red for her wrist up to her deltoid.  She has some fluctuance over her olecranon bursa.  No pain with range of motion of the elbow.  Skin is warm.  No crepitus.  Skin:    General: Skin is warm and dry.  Neurological:     Mental Status: She is alert. Mental status is at baseline.     Comments: She has no use of her legs.  She has some use of her arms.  Normal speech no facial asymmetry       ED Results / Procedures / Treatments   Labs (all labs ordered are listed, but only abnormal results are displayed) Labs Reviewed  BASIC METABOLIC PANEL - Abnormal; Notable for the following components:      Result Value   Glucose, Bld 126 (*)    Calcium 8.7 (*)    All other components within normal limits  CBC WITH DIFFERENTIAL/PLATELET - Abnormal; Notable for the following components:   Hemoglobin 11.9 (*)    All other components within normal limits  URINALYSIS, ROUTINE W REFLEX MICROSCOPIC - Abnormal; Notable for the following components:   Ketones, ur 5 (*)    Leukocytes,Ua SMALL (*)    Bacteria, UA RARE (*)    All other components within normal limits  COMPREHENSIVE METABOLIC PANEL - Abnormal; Notable for the following components:   Glucose, Bld 118 (*)  All other components within normal limits  CULTURE, BLOOD (ROUTINE X 2)  CULTURE, BLOOD (ROUTINE X 2)  RESPIRATORY PANEL BY RT PCR (FLU A&B, COVID)  URINE CULTURE  LACTIC ACID, PLASMA  HIV ANTIBODY (ROUTINE TESTING W REFLEX)  CBC    EKG None  Radiology DG Abd 1 View  Result Date: 04/22/2020 CLINICAL DATA:  Abdominal distension. EXAM: ABDOMEN - 1 VIEW COMPARISON:  Acute abdominal series 09/27/2016 FINDINGS: Bowel gas pattern is normal. Gas is present stomach.  Small bowel and colon are unremarkable. Definite free air is present. Rightward curvature of the lumbar spine is again noted. Degenerative changes are present in the hips. IMPRESSION: 1. No acute abnormality or significant interval change. 2. Scoliosis. Electronically Signed   By: Marin Roberts M.D.   On: 04/22/2020 04:11    Procedures Procedures (including critical care time)  Medications Ordered in ED Medications  aspirin chewable tablet 81 mg (81 mg Oral Given 04/22/20 1111)  morphine (MS CONTIN) 12 hr tablet 15 mg (15 mg Oral Given 04/22/20 1112)  docusate sodium (COLACE) capsule 100 mg (100 mg Oral Given 04/22/20 1112)  acetaminophen (TYLENOL) tablet 650 mg (650 mg Oral Given 04/22/20 0613)    Or  acetaminophen (TYLENOL) suppository 650 mg ( Rectal See Alternative 04/22/20 0613)  ceFEPIme (MAXIPIME) 2 g in sodium chloride 0.9 % 100 mL IVPB (0 g Intravenous Stopped 04/22/20 0555)  vancomycin (VANCOCIN) IVPB 1000 mg/200 mL premix (1,000 mg Intravenous New Bag/Given 04/22/20 1117)  heparin injection 5,000 Units (5,000 Units Subcutaneous Given 04/22/20 0523)  Chlorhexidine Gluconate Cloth 2 % PADS 6 each (has no administration in time range)  0.9 %  sodium chloride infusion (250 mLs Intravenous New Bag/Given 04/22/20 1117)  cefTRIAXone (ROCEPHIN) 1 g in sodium chloride 0.9 % 100 mL IVPB (0 g Intravenous Stopped 04/21/20 2201)  vancomycin (VANCOREADY) IVPB 1500 mg/300 mL (0 mg Intravenous Stopped 04/22/20 0225)  ceFEPIme (MAXIPIME) 2 g in sodium chloride 0.9 % 100 mL IVPB (0 g Intravenous Stopped 04/21/20 2336)    ED Course  I have reviewed the triage vital signs and the nursing notes.  Pertinent labs & imaging results that were available during my care of the patient were reviewed by me and considered in my medical decision making (see chart for details).  Clinical Course as of Apr 22 1232  Thu Apr 21, 2020  2100 Discussed with Dr. Toniann Fail who asked if I could consult  orthopedics and get their opinion whether the bursa should be drained.   [MB]  2205 Discussed with Dr. Victorino Dike orthopedics.  He recommends no drainage of the bursa.  He said antibiotics and follow-up in the office in a couple weeks.  Consider Ace bandage to give a little compression.   [MB]    Clinical Course User Index [MB] Terrilee Files, MD   MDM Rules/Calculators/A&P                         This patient complains of swelling and redness to left arm; this involves an extensive number of treatment Options and is a complaint that carries with it a high risk of complications and Morbidity. The differential includes bursitis, cellulitis, septic joint, DVT  I ordered, reviewed and interpreted labs, which included CBC with normal white count normal hemoglobin, chemistries normal other than mildly elevated glucose, Covid testing negative.  Blood cultures were drawn and are pending I ordered medication IV ceftriaxone Previous records obtained and reviewed in epic, no  recent admission I consulted Dr. Victorino Dike orthopedics and [facility] Dr. Toniann Fail and discussed lab and imaging findings  Critical Interventions: None  After the interventions stated above, I reevaluated the patient and found patient to be hemodynamically stable with no evidence of toxicity.  Due to her failure of oral antibiotics will admit for IV antibiotics.  Orthopedics not recommending any incision and drainage currently of her bursa.  Patient agreeable to plan.   Final Clinical Impression(s) / ED Diagnoses Final diagnoses:  Cellulitis of left upper extremity  Olecranon bursitis of left elbow  Quadriplegia and quadriparesis (HCC)    Rx / DC Orders ED Discharge Orders    None       Terrilee Files, MD 04/22/20 1238

## 2020-04-22 ENCOUNTER — Inpatient Hospital Stay (HOSPITAL_COMMUNITY): Payer: Medicare Other

## 2020-04-22 DIAGNOSIS — G950 Syringomyelia and syringobulbia: Secondary | ICD-10-CM

## 2020-04-22 DIAGNOSIS — L03114 Cellulitis of left upper limb: Principal | ICD-10-CM

## 2020-04-22 DIAGNOSIS — G8250 Quadriplegia, unspecified: Secondary | ICD-10-CM

## 2020-04-22 DIAGNOSIS — M7022 Olecranon bursitis, left elbow: Secondary | ICD-10-CM

## 2020-04-22 LAB — CBC
HCT: 41 % (ref 36.0–46.0)
Hemoglobin: 13.2 g/dL (ref 12.0–15.0)
MCH: 29.6 pg (ref 26.0–34.0)
MCHC: 32.2 g/dL (ref 30.0–36.0)
MCV: 91.9 fL (ref 80.0–100.0)
Platelets: 232 10*3/uL (ref 150–400)
RBC: 4.46 MIL/uL (ref 3.87–5.11)
RDW: 13.2 % (ref 11.5–15.5)
WBC: 7.3 10*3/uL (ref 4.0–10.5)
nRBC: 0 % (ref 0.0–0.2)

## 2020-04-22 LAB — URINALYSIS, ROUTINE W REFLEX MICROSCOPIC
Bilirubin Urine: NEGATIVE
Glucose, UA: NEGATIVE mg/dL
Hgb urine dipstick: NEGATIVE
Ketones, ur: 5 mg/dL — AB
Nitrite: NEGATIVE
Protein, ur: NEGATIVE mg/dL
Specific Gravity, Urine: 1.015 (ref 1.005–1.030)
pH: 6 (ref 5.0–8.0)

## 2020-04-22 LAB — COMPREHENSIVE METABOLIC PANEL
ALT: 24 U/L (ref 0–44)
AST: 17 U/L (ref 15–41)
Albumin: 4 g/dL (ref 3.5–5.0)
Alkaline Phosphatase: 70 U/L (ref 38–126)
Anion gap: 13 (ref 5–15)
BUN: 15 mg/dL (ref 8–23)
CO2: 23 mmol/L (ref 22–32)
Calcium: 9 mg/dL (ref 8.9–10.3)
Chloride: 101 mmol/L (ref 98–111)
Creatinine, Ser: 0.56 mg/dL (ref 0.44–1.00)
GFR, Estimated: >60 mL/min (ref >60)
Glucose, Bld: 118 mg/dL — ABNORMAL HIGH (ref 70–99)
Potassium: 3.7 mmol/L (ref 3.5–5.1)
Sodium: 137 mmol/L (ref 135–145)
Total Bilirubin: 0.9 mg/dL (ref 0.3–1.2)
Total Protein: 7.8 g/dL (ref 6.5–8.1)

## 2020-04-22 LAB — RESPIRATORY PANEL BY RT PCR (FLU A&B, COVID)
Influenza A by PCR: NEGATIVE
Influenza B by PCR: NEGATIVE
SARS Coronavirus 2 by RT PCR: NEGATIVE

## 2020-04-22 LAB — HIV ANTIBODY (ROUTINE TESTING W REFLEX): HIV Screen 4th Generation wRfx: NONREACTIVE

## 2020-04-22 MED ORDER — LEVOTHYROXINE SODIUM 25 MCG PO TABS
12.5000 ug | ORAL_TABLET | Freq: Every day | ORAL | Status: DC
  Administered 2020-04-23 – 2020-04-29 (×7): 12.5 ug via ORAL
  Filled 2020-04-22 (×7): qty 1

## 2020-04-22 MED ORDER — POLYETHYLENE GLYCOL 3350 17 G PO PACK
17.0000 g | PACK | Freq: Two times a day (BID) | ORAL | Status: DC
  Administered 2020-04-22 – 2020-04-29 (×14): 17 g via ORAL
  Filled 2020-04-22 (×15): qty 1

## 2020-04-22 MED ORDER — BACLOFEN 20 MG PO TABS
20.0000 mg | ORAL_TABLET | Freq: Three times a day (TID) | ORAL | Status: DC
  Administered 2020-04-22 – 2020-04-29 (×21): 20 mg via ORAL
  Filled 2020-04-22 (×21): qty 1

## 2020-04-22 MED ORDER — SODIUM CHLORIDE 0.9 % IV SOLN
INTRAVENOUS | Status: DC | PRN
  Administered 2020-04-22: 250 mL via INTRAVENOUS

## 2020-04-22 MED ORDER — CHLORHEXIDINE GLUCONATE CLOTH 2 % EX PADS
6.0000 | MEDICATED_PAD | Freq: Every day | CUTANEOUS | Status: DC
  Administered 2020-04-22 – 2020-04-29 (×8): 6 via TOPICAL

## 2020-04-22 MED ORDER — MORPHINE SULFATE ER 15 MG PO TBCR
15.0000 mg | EXTENDED_RELEASE_TABLET | Freq: Two times a day (BID) | ORAL | Status: DC
  Administered 2020-04-22 – 2020-04-25 (×6): 15 mg via ORAL
  Filled 2020-04-22 (×6): qty 1

## 2020-04-22 MED ORDER — HEPARIN SODIUM (PORCINE) 5000 UNIT/ML IJ SOLN
5000.0000 [IU] | Freq: Three times a day (TID) | INTRAMUSCULAR | Status: DC
  Administered 2020-04-22 – 2020-04-29 (×22): 5000 [IU] via SUBCUTANEOUS
  Filled 2020-04-22 (×23): qty 1

## 2020-04-22 MED ORDER — BACITRACIN-NEOMYCIN-POLYMYXIN 400-5-5000 EX OINT: 1.0000 "application " | TOPICAL_OINTMENT | Freq: Every day | CUTANEOUS | Status: DC

## 2020-04-22 MED ORDER — BISACODYL 10 MG RE SUPP
10.0000 mg | RECTAL | Status: DC
  Administered 2020-04-25: 10 mg via RECTAL
  Filled 2020-04-22: qty 1

## 2020-04-22 MED ORDER — CEFAZOLIN SODIUM-DEXTROSE 2-4 GM/100ML-% IV SOLN
2.0000 g | Freq: Three times a day (TID) | INTRAVENOUS | Status: DC
  Administered 2020-04-22 – 2020-04-27 (×15): 2 g via INTRAVENOUS
  Filled 2020-04-22 (×15): qty 100

## 2020-04-22 MED ORDER — LACTULOSE 10 GM/15ML PO SOLN
30.0000 g | Freq: Every day | ORAL | Status: DC | PRN
  Filled 2020-04-22: qty 45

## 2020-04-22 MED ORDER — PANTOPRAZOLE SODIUM 40 MG PO TBEC
40.0000 mg | DELAYED_RELEASE_TABLET | Freq: Every day | ORAL | Status: DC
  Administered 2020-04-23 – 2020-04-29 (×6): 40 mg via ORAL
  Filled 2020-04-22 (×8): qty 1

## 2020-04-22 MED ORDER — ASCORBIC ACID 500 MG PO TABS
1000.0000 mg | ORAL_TABLET | Freq: Every day | ORAL | Status: DC
  Administered 2020-04-22 – 2020-04-29 (×8): 1000 mg via ORAL
  Filled 2020-04-22 (×7): qty 2

## 2020-04-22 MED ORDER — SENNA 8.6 MG PO TABS
2.0000 | ORAL_TABLET | Freq: Every day | ORAL | Status: DC
  Administered 2020-04-22 – 2020-04-28 (×7): 17.2 mg via ORAL
  Filled 2020-04-22 (×7): qty 2

## 2020-04-22 NOTE — Plan of Care (Signed)

## 2020-04-22 NOTE — Progress Notes (Signed)
PROGRESS NOTE    Deanna Morgan  NTI:[id number] DOB: 1950-09-15 DOA: 04/21/2020 PCP: Florentina Jenny, MD     Brief Narrative:  69 y.o. 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  Was started on Keflex by patient's primary care physician about 3 days ago.  Patient states she has been rubbing her elbow on a chair which she states and noticed swelling around the left elbow and started getting red which started to spread proximally and distally.  Despite taking oral antibiotics patient swelling has got worse and presents to the ER.  ED Course: In the ER patient was afebrile on exam patient does have swelling around the olecranon bursa with redness of the arm and forearm on the left side.  ER physician had discussed with on-call orthopedic surgeon Dr. Victorino Dike who advised at this time IV antibiotics and Ace wrap and follow-up with orthopedics as outpatient in 2 weeks after antibiotics.  Given that patient has failed oral antibiotics and cellulitis is getting worse patient admitted for IV antibiotics.  Labs show hemoglobin of 11.9 Covid test negative.  Since patient has been having some bloating sensation in the abdomen x-ray of the abdomen was done which does not show anything acute.   Subjective: A/O x4 quadriparesis, has the use of her bilateral upper arms. States believes the reason she obtained LEFT arm cellulitis was because she tends to lean that way in her wheelchair and she shaved her arm. States does not recall ever having MRSA or Pseudomonas to the best of her knowledge.   Assessment & Plan: Covid vaccination;   Principal Problem:   Cellulitis of left upper extremity Active  Problems:   Quadriparesis (HCC)   Syringomyelia (HCC)   HLD (hyperlipidemia)   Cellulitis  LEFT arm Cellulitis with Olecranon bursitis -After speaking with patient who is very knowledgeable concerning her body.  Patient fairly certain she is never had MRSA or Pseudomonas, changed antibiotic to cefazolin. -7 to 14-day course cefazolin dependent upon how well patient responds given her debilitated state  Quadriparesis -Baclofen 20 mg TID -MS Contin 15 mg BID -We will order air mattress  Chronic pain syndrome -See quadriparesis  HLD -Lipid panel pending  Anemia -Currently patient not anemic will monitor closely   DVT prophylaxis: Subcu heparin Code Status: DNR Family Communication:  Status is: Inpatient    Dispo: The patient is from: SNF              Anticipated d/c is to: SNF              Anticipated d/c date is:??              Patient currently unstable      Consultants:    Procedures/Significant Events:    I have personally reviewed and interpreted all radiology studies and my findings are as above.  VENTILATOR SETTINGS:    Cultures   Antimicrobials: Anti-infectives (From admission, onward)   Start     Ordered Stop   04/22/20 1430  ceFAZolin (ANCEF) IVPB 2g/100 mL premix        04/22/20 1338     04/22/20 1100  vancomycin (VANCOCIN) IVPB 1000 mg/200 mL premix  Status:  Discontinued  04/21/20 2209 04/22/20 1328   04/22/20 0600  ceFEPIme (MAXIPIME) 2 g in sodium chloride 0.9 % 100 mL IVPB  Status:  Discontinued        04/21/20 2209 04/22/20 1328   04/21/20 2230  vancomycin (VANCOREADY) IVPB 1500 mg/300 mL        04/21/20 2158 04/22/20 0225   04/21/20 2200  ceFEPIme (MAXIPIME) 2 g in sodium chloride 0.9 % 100 mL IVPB        04/21/20 2158 04/21/20 2336   04/21/20 2100  cefTRIAXone (ROCEPHIN) 1 g in sodium chloride 0.9 % 100 mL IVPB        04/21/20 2049 04/21/20 2201       Devices    LINES / TUBES:      Continuous Infusions: .  ceFEPime (MAXIPIME) IV Stopped (04/22/20 0555)  . vancomycin       Objective: Vitals:   04/22/20 0330 04/22/20 0558 04/22/20 0700 04/22/20 0755  BP: (!) 137/92 (!) 184/125 (!) 159/100 (!) 165/89  Pulse: 78 87 78 72  Resp: (!) 22 16  20   Temp:   98 F (36.7 C) 98.2 F (36.8 C)  TempSrc:    Oral  SpO2: 94% 98% 96% 94%  Weight:      Height:        Intake/Output Summary (Last 24 hours) at 04/22/2020 0901 Last data filed at 04/22/2020 04/24/2020 Gross per 24 hour  Intake --  Output 1000 ml  Net -1000 ml   Filed Weights   04/21/20 1914  Weight: 70.3 kg    Examination:  General: A/O x4, No acute respiratory distress Eyes: negative scleral hemorrhage, negative anisocoria, negative icterus ENT: Negative Runny nose, negative gingival bleeding, Neck:  Negative scars, masses, torticollis, lymphadenopathy, JVD Lungs: Clear to auscultation bilaterally without wheezes or crackles Cardiovascular: Regular rate and rhythm without murmur gallop or rub normal S1 and S2 Abdomen: negative abdominal pain, nondistended, positive soft, bowel sounds, no rebound, no ascites, no appreciable mass Extremities: No significant cyanosis, clubbing, or edema bilateral lower extremities Skin: Negative rashes, lesions, ulcers Psychiatric:  Negative depression, negative anxiety, negative fatigue, negative mania  Central nervous system:  Cranial nerves II through XII intact, tongue/uvula midline, upper extremities muscle strength 3-4 /5 (with contraction and loss of fine motor skill), lower extremity muscle strength 0/5, sensation intact throughout, negative dysarthria, negative expressive aphasia, negative receptive aphasia.  .     Data Reviewed: Care during the described time interval was provided by me .  I have reviewed this patient's available data, including medical history, events of note, physical examination, and all test results as part of my evaluation.  CBC: Recent Labs  Lab 04/21/20 1903  04/22/20 0500  WBC 6.2 7.3  NEUTROABS 4.5  --   HGB 11.9* 13.2  HCT 37.7 41.0  MCV 91.3 91.9  PLT 205 232   Basic Metabolic Panel: Recent Labs  Lab 04/21/20 1903 04/22/20 0500  NA 136 137  K 4.2 3.7  CL 100 101  CO2 27 23  GLUCOSE 126* 118*  BUN 19 15  CREATININE 0.65 0.56  CALCIUM 8.7* 9.0   GFR: Estimated Creatinine Clearance: 61.6 mL/min (by C-G formula based on SCr of 0.56 mg/dL). Liver Function Tests: Recent Labs  Lab 04/22/20 0500  AST 17  ALT 24  ALKPHOS 70  BILITOT 0.9  PROT 7.8  ALBUMIN 4.0   No results for input(s): LIPASE, AMYLASE in the last 168 hours. No results for input(s): AMMONIA in the last 168  hours. Coagulation Profile: No results for input(s): INR, PROTIME in the last 168 hours. Cardiac Enzymes: No results for input(s): CKTOTAL, CKMB, CKMBINDEX, TROPONINI in the last 168 hours. BNP (last 3 results) No results for input(s): PROBNP in the last 8760 hours. HbA1C: No results for input(s): HGBA1C in the last 72 hours. CBG: No results for input(s): GLUCAP in the last 168 hours. Lipid Profile: No results for input(s): CHOL, HDL, LDLCALC, TRIG, CHOLHDL, LDLDIRECT in the last 72 hours. Thyroid Function Tests: No results for input(s): TSH, T4TOTAL, FREET4, T3FREE, THYROIDAB in the last 72 hours. Anemia Panel: No results for input(s): VITAMINB12, FOLATE, FERRITIN, TIBC, IRON, RETICCTPCT in the last 72 hours. Sepsis Labs: Recent Labs  Lab 04/21/20 1903  LATICACIDVEN 1.0    Recent Results (from the past 240 hour(s))  Culture, blood (routine x 2)     Status: None (Preliminary result)   Collection Time: 04/21/20  7:02 PM   Specimen: BLOOD  Result Value Ref Range Status   Specimen Description   Final    BLOOD RIGHT Performed at [facility], [address] [place], [place] [address]    Special Requests   Final    BOTTLES DRAWN AEROBIC AND ANAEROBIC Blood Culture results may not be optimal due to an inadequate volume of  blood received in culture bottles Performed at [facility], [address] [place], [place] [address]    Culture   Final    NO GROWTH < 12 HOURS Performed at [facility], [address], [place], [place] [address]    Report Status PENDING  Incomplete  Culture, blood (routine x 2)     Status: None (Preliminary result)   Collection Time: 04/21/20  7:07 PM   Specimen: BLOOD  Result Value Ref Range Status   Specimen Description   Final    BLOOD LEFT Performed at [facility], [address], [place], [place] [address]    Special Requests   Final    BOTTLES DRAWN AEROBIC AND ANAEROBIC Blood Culture results may not be optimal due to an inadequate volume of blood received in culture bottles Performed at [facility], [address] [place], [place] [address]    Culture   Final    NO GROWTH < 12 HOURS Performed at [facility], [address] [place], [place] [address]    Report Status PENDING  Incomplete  Respiratory Panel by RT PCR (Flu A&B, Covid) - Nasopharyngeal Swab     Status: None   Collection Time: 04/21/20 10:00 PM   Specimen: Nasopharyngeal Swab  Result Value Ref Range Status   SARS Coronavirus 2 by RT PCR NEGATIVE NEGATIVE Final    Comment: (NOTE) SARS-CoV-2 target nucleic acids are NOT DETECTED.  The SARS-CoV-2 RNA is generally detectable in upper respiratoy specimens during the acute phase of infection. The lowest concentration of SARS-CoV-2 viral copies this assay can detect is 131 copies/mL. A negative result does not preclude SARS-Cov-2 infection and should not be used as the sole basis for treatment or other patient management decisions. A negative result may occur with  improper specimen collection/handling, submission of specimen other than nasopharyngeal swab, presence of viral mutation(s) within the areas targeted by this assay, and inadequate number of viral copies (<131 copies/mL). A  negative result must be combined with clinical observations, patient history, and epidemiological information. The expected result is Negative.  Fact Sheet for Patients:  https://www.moore.com/  Fact Sheet for Healthcare  Providers:  https://www.young.biz/  This test is no t yet approved or cleared by the [place] and  has been authorized for detection and/or diagnosis of SARS-CoV-2 by FDA under an Emergency Use Authorization (EUA). This EUA will remain  in effect (meaning this test can be used) for the duration of the COVID-19 declaration under Section 564(b)(1) of the Act, 21 U.S.C. section 360bbb-3(b)(1), unless the authorization is terminated or revoked sooner.     Influenza A by PCR NEGATIVE NEGATIVE Final   Influenza B by PCR NEGATIVE NEGATIVE Final    Comment: (NOTE) The Xpert Xpress SARS-CoV-2/FLU/RSV assay is intended as an aid in  the diagnosis of influenza from Nasopharyngeal swab specimens and  should not be used as a sole basis for treatment. Nasal washings and  aspirates are unacceptable for Xpert Xpress SARS-CoV-2/FLU/RSV  testing.  Fact Sheet for Patients: https://www.moore.com/  Fact Sheet for Healthcare Providers: https://www.young.biz/  This test is not yet approved or cleared by the [place] FDA and  has been authorized for detection and/or diagnosis of SARS-CoV-2 by  FDA under an Emergency Use Authorization (EUA). This EUA will remain  in effect (meaning this test can be used) for the duration of the  Covid-19 declaration under Section 564(b)(1) of the Act, 21  U.S.C. section 360bbb-3(b)(1), unless the authorization is  terminated or revoked. Performed at [facility], [address] [place], [place] [address]          Radiology Studies: DG Abd 1 View  Result Date: 04/22/2020 CLINICAL DATA:  Abdominal distension. EXAM: ABDOMEN - 1 VIEW  COMPARISON:  Acute abdominal series 09/27/2016 FINDINGS: Bowel gas pattern is normal. Gas is present stomach. Small bowel and colon are unremarkable. Definite free air is present. Rightward curvature of the lumbar spine is again noted. Degenerative changes are present in the hips. IMPRESSION: 1. No acute abnormality or significant interval change. 2. Scoliosis. Electronically Signed   By: Marin Roberts M.D.   On: 04/22/2020 04:11        Scheduled Meds: . aspirin  81 mg Oral Daily  . Chlorhexidine Gluconate Cloth  6 each Topical Daily  . docusate sodium  100 mg Oral BID  . heparin injection (subcutaneous)  5,000 Units Subcutaneous Q8H  . morphine  15 mg Oral Daily   Continuous Infusions: . ceFEPime (MAXIPIME) IV Stopped (04/22/20 0555)  . vancomycin       LOS: 1 day    Time spent:40 min    Leroi Haque, Roselind Messier, MD [facility] Pager [phone]  If 7PM-7AM, please contact night-coverage [email or web address] Password [email or web address] 04/22/2020, 9:01 AM

## 2020-04-22 NOTE — ED Notes (Signed)
Pt linens changed. New urine bag applied to catheter. Pt repositioned and given medication for headache.

## 2020-04-22 NOTE — Consult Note (Signed)
Reason for Consult:  Left elbow redness Referring Physician: Dr. Ulanda Edison Bir is an 69 y.o. female.  HPI: 70 y/o female with PMH of quadraplegia c/o left elbow redness that has been going on for a few days.  She sits in a wheelchair most of the day and leans to the left on her elbow.  She denies any f/c/n/v.  No h/o injury or surgery to her elbow.  SHe is not a smoker and is not diabetic.  Past Medical History:  Diagnosis Date  . Anxiety   . 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 CHF (congestive heart failure) (HCC)   . Chronic constipation   . Chronic pain   . Chronic UTI   . GERD (gastroesophageal reflux disease)   . Hyperlipidemia   . Hypertension   . Neuromuscular disorder (HCC)   . Paralysis (HCC)   . Peripheral vascular disease (HCC)   . Shortness of breath dyspnea   . Syringomyelia [facility])     Past Surgical History:  Procedure Laterality Date  . NECK SURGERY      Family History  Problem Relation Age of Onset  . Heart failure Mother   . Heart failure Father     Social History:  reports that she has never smoked. She has never used smokeless tobacco. She reports that she does not drink alcohol and does not use drugs.  Allergies: No Known Allergies  Medications: I have reviewed the patient's current medications.  Results for orders placed or performed during the hospital encounter of 04/21/20 (from the past 48 hour(s))  Basic metabolic panel     Status: Abnormal   Collection Time: 04/21/20  7:03 PM  Result Value Ref Range   Sodium 136 135 - 145 mmol/L   Potassium 4.2 3.5 - 5.1 mmol/L   Chloride 100 98 - 111 mmol/L   CO2 27 22 - 32 mmol/L   Glucose, Bld 126 (H) 70 - 99 mg/dL    Comment: Glucose reference range applies only to samples taken after fasting for at least 8 hours.   BUN 19 8 - 23 mg/dL   Creatinine, Ser 3.71 0.44 - 1.00 mg/dL   Calcium 8.7 (L) 8.9 - 10.3 mg/dL   GFR, Estimated >69 >67 mL/min   Anion gap 9 5 - 15    Comment: Performed at  [facility], [address] [place], [place] [address]  CBC with Differential     Status: Abnormal   Collection Time: 04/21/20  7:03 PM  Result Value Ref Range   WBC 6.2 4.0 - 10.5 K/uL   RBC 4.13 3.87 - 5.11 MIL/uL   Hemoglobin 11.9 (L) 12.0 - 15.0 g/dL   HCT 01.7 36 - 46 %   MCV 91.3 80.0 - 100.0 fL   MCH 28.8 26.0 - 34.0 pg   MCHC 31.6 30.0 - 36.0 g/dL   RDW 51.0 25.8 - 52.7 %   Platelets 205 150 - 400 K/uL   nRBC 0.0 0.0 - 0.2 %   Neutrophils Relative % 73 %   Neutro Abs 4.5 1.7 - 7.7 K/uL   Lymphocytes Relative 15 %   Lymphs Abs 0.9 0.7 - 4.0 K/uL   Monocytes Relative 10 %   Monocytes Absolute 0.6 0.1 - 1.0 K/uL   Eosinophils Relative 2 %   Eosinophils Absolute 0.1 0.0 - 0.5 K/uL   Basophils Relative 0 %   Basophils Absolute 0.0 0.0 - 0.1 K/uL   Immature Granulocytes 0 %  Abs Immature Granulocytes 0.01 0.00 - 0.07 K/uL    Comment: Performed at [facility], [address], [place], [place] [address]  Lactic acid, plasma     Status: None   Collection Time: 04/21/20  7:03 PM  Result Value Ref Range   Lactic Acid, Venous 1.0 0.5 - 1.9 mmol/L    Comment: Performed at [facility], [address], [place], [place] [address]  Urinalysis, Routine w reflex microscopic     Status: Abnormal   Collection Time: 04/21/20  7:03 PM  Result Value Ref Range   Color, Urine YELLOW YELLOW   APPearance CLEAR CLEAR   Specific Gravity, Urine 1.015 1.005 - 1.030   pH 6.0 5.0 - 8.0   Glucose, UA NEGATIVE NEGATIVE mg/dL   Hgb urine dipstick NEGATIVE NEGATIVE   Bilirubin Urine NEGATIVE NEGATIVE   Ketones, ur 5 (A) NEGATIVE mg/dL   Protein, ur NEGATIVE NEGATIVE mg/dL   Nitrite NEGATIVE NEGATIVE   Leukocytes,Ua SMALL (A) NEGATIVE   RBC / HPF 0-5 0 - 5 RBC/hpf   WBC, UA 0-5 0 - 5 WBC/hpf   Bacteria, UA RARE (A) NONE SEEN   Squamous Epithelial / LPF 0-5 0 - 5   Mucus PRESENT     Comment: Performed at [facility],  [address], [place], [place] [address]  Respiratory Panel by RT PCR (Flu A&B, Covid) - Nasopharyngeal Swab     Status: None   Collection Time: 04/21/20 10:00 PM   Specimen: Nasopharyngeal Swab  Result Value Ref Range   SARS Coronavirus 2 by RT PCR NEGATIVE NEGATIVE    Comment: (NOTE) SARS-CoV-2 target nucleic acids are NOT DETECTED.  The SARS-CoV-2 RNA is generally detectable in upper respiratoy specimens during the acute phase of infection. The lowest concentration of SARS-CoV-2 viral copies this assay can detect is 131 copies/mL. A negative result does not preclude SARS-Cov-2 infection and should not be used as the sole basis for treatment or other patient management decisions. A negative result may occur with  improper specimen collection/handling, submission of specimen other than nasopharyngeal swab, presence of viral mutation(s) within the areas targeted by this assay, and inadequate number of viral copies (<131 copies/mL). A negative result must be combined with clinical observations, patient history, and epidemiological information. The expected result is Negative.  Fact Sheet for Patients:  https://www.moore.com/  Fact Sheet for Healthcare Providers:  https://www.young.biz/  This test is no t yet approved or cleared by the [place] FDA and  has been authorized for detection and/or diagnosis of SARS-CoV-2 by FDA under an Emergency Use Authorization (EUA). This EUA will remain  in effect (meaning this test can be used) for the duration of the COVID-19 declaration under Section 564(b)(1) of the Act, 21 U.S.C. section 360bbb-3(b)(1), unless the authorization is terminated or revoked sooner.     Influenza A by PCR NEGATIVE NEGATIVE   Influenza B by PCR NEGATIVE NEGATIVE    Comment: (NOTE) The Xpert Xpress SARS-CoV-2/FLU/RSV assay is intended as an aid in  the diagnosis of influenza from Nasopharyngeal swab specimens and   should not be used as a sole basis for treatment. Nasal washings and  aspirates are unacceptable for Xpert Xpress SARS-CoV-2/FLU/RSV  testing.  Fact Sheet for Patients: https://www.moore.com/  Fact Sheet for Healthcare Providers: https://www.young.biz/  This test is not yet approved or cleared by the [place] FDA and  has been authorized for detection and/or diagnosis of SARS-CoV-2 by  FDA under an Emergency  Use Authorization (EUA). This EUA will remain  in effect (meaning this test can be used) for the duration of the  Covid-19 declaration under Section 564(b)(1) of the Act, 21  U.S.C. section 360bbb-3(b)(1), unless the authorization is  terminated or revoked. Performed at [facility], [address], [place], [place] [address]   Comprehensive metabolic panel     Status: Abnormal   Collection Time: 04/22/20  5:00 AM  Result Value Ref Range   Sodium 137 135 - 145 mmol/L   Potassium 3.7 3.5 - 5.1 mmol/L   Chloride 101 98 - 111 mmol/L   CO2 23 22 - 32 mmol/L   Glucose, Bld 118 (H) 70 - 99 mg/dL    Comment: Glucose reference range applies only to samples taken after fasting for at least 8 hours.   BUN 15 8 - 23 mg/dL   Creatinine, Ser 9.32 0.44 - 1.00 mg/dL   Calcium 9.0 8.9 - 67.1 mg/dL   Total Protein 7.8 6.5 - 8.1 g/dL   Albumin 4.0 3.5 - 5.0 g/dL   AST 17 15 - 41 U/L   ALT 24 0 - 44 U/L   Alkaline Phosphatase 70 38 - 126 U/L   Total Bilirubin 0.9 0.3 - 1.2 mg/dL   GFR, Estimated >24 >58 mL/min   Anion gap 13 5 - 15    Comment: Performed at [facility], [address], [place], [place] [address]  CBC     Status: None   Collection Time: 04/22/20  5:00 AM  Result Value Ref Range   WBC 7.3 4.0 - 10.5 K/uL   RBC 4.46 3.87 - 5.11 MIL/uL   Hemoglobin 13.2 12.0 - 15.0 g/dL   HCT 38.2 36 - 46 %   MCV 91.9 80.0 - 100.0 fL   MCH 29.6 26.0 - 34.0 pg   MCHC 32.2 30.0 - 36.0 g/dL   RDW 50.5 39.7  - 67.3 %   Platelets 232 150 - 400 K/uL   nRBC 0.0 0.0 - 0.2 %    Comment: Performed at [facility], [address], [place], [place] [address]    DG Abd 1 View  Result Date: 04/22/2020 CLINICAL DATA:  Abdominal distension. EXAM: ABDOMEN - 1 VIEW COMPARISON:  Acute abdominal series 09/27/2016 FINDINGS: Bowel gas pattern is normal. Gas is present stomach. Small bowel and colon are unremarkable. Definite free air is present. Rightward curvature of the lumbar spine is again noted. Degenerative changes are present in the hips. IMPRESSION: 1. No acute abnormality or significant interval change. 2. Scoliosis. Electronically Signed   By: Marin Roberts M.D.   On: 04/22/2020 04:11    ROS:   No recent f/c/n/v/wt loss.  10 system review is o/w negative. PE:  Blood pressure (!) 165/89, pulse 72, temperature 98.2 F (36.8 C), temperature source Oral, resp. rate 20, height 5\' 3"  (1.6 m), weight 70.3 kg, SpO2 94 %. wn wd woman in nad.  A and O x 4.  Normal mood and affect.  EOMI.  resp unlabored.  L elbow with redness.  No fluctuance.  NTTP at the olecranon bursa.  No evident breaks in the skin.  5/5 strength at the triceps. No lymphdaenopathy.  sens to LT dimished at the L UE.  Assessment/Plan: Left arm cellulitis - there is not a surgical indication at this time.  Concur with treatment for cellulitis.  We'll check on her over the weekend to assess for clinical improvement.   04/22/2020, 7:58 AM
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
## 2020-04-22 NOTE — ED Notes (Signed)
Patient repositioned and leg bag emptied

## 2020-04-23 ENCOUNTER — Inpatient Hospital Stay (HOSPITAL_COMMUNITY): Payer: Medicare Other

## 2020-04-23 DIAGNOSIS — R140 Abdominal distension (gaseous): Secondary | ICD-10-CM

## 2020-04-23 LAB — CBC WITH DIFFERENTIAL/PLATELET
Abs Immature Granulocytes: 0.01 10*3/uL (ref 0.00–0.07)
Basophils Absolute: 0 10*3/uL (ref 0.0–0.1)
Basophils Relative: 1 %
Eosinophils Absolute: 0.1 10*3/uL (ref 0.0–0.5)
Eosinophils Relative: 3 %
HCT: 36.9 % (ref 36.0–46.0)
Hemoglobin: 11.6 g/dL — ABNORMAL LOW (ref 12.0–15.0)
Immature Granulocytes: 0 %
Lymphocytes Relative: 27 %
Lymphs Abs: 1.2 10*3/uL (ref 0.7–4.0)
MCH: 28.6 pg (ref 26.0–34.0)
MCHC: 31.4 g/dL (ref 30.0–36.0)
MCV: 90.9 fL (ref 80.0–100.0)
Monocytes Absolute: 0.4 10*3/uL (ref 0.1–1.0)
Monocytes Relative: 10 %
Neutro Abs: 2.6 10*3/uL (ref 1.7–7.7)
Neutrophils Relative %: 59 %
Platelets: 224 10*3/uL (ref 150–400)
RBC: 4.06 MIL/uL (ref 3.87–5.11)
RDW: 13.2 % (ref 11.5–15.5)
WBC: 4.3 10*3/uL (ref 4.0–10.5)
nRBC: 0 % (ref 0.0–0.2)

## 2020-04-23 LAB — COMPREHENSIVE METABOLIC PANEL
ALT: 20 U/L (ref 0–44)
AST: 17 U/L (ref 15–41)
Albumin: 3.4 g/dL — ABNORMAL LOW (ref 3.5–5.0)
Alkaline Phosphatase: 56 U/L (ref 38–126)
Anion gap: 10 (ref 5–15)
BUN: 14 mg/dL (ref 8–23)
CO2: 26 mmol/L (ref 22–32)
Calcium: 8.8 mg/dL — ABNORMAL LOW (ref 8.9–10.3)
Chloride: 102 mmol/L (ref 98–111)
Creatinine, Ser: 0.59 mg/dL (ref 0.44–1.00)
GFR, Estimated: >60 mL/min (ref >60)
Glucose, Bld: 101 mg/dL — ABNORMAL HIGH (ref 70–99)
Potassium: 3.4 mmol/L — ABNORMAL LOW (ref 3.5–5.1)
Sodium: 138 mmol/L (ref 135–145)
Total Bilirubin: 0.5 mg/dL (ref 0.3–1.2)
Total Protein: 6.8 g/dL (ref 6.5–8.1)

## 2020-04-23 LAB — LIPID PANEL
Cholesterol: 148 mg/dL (ref 0–200)
HDL: 40 mg/dL — ABNORMAL LOW (ref >40)
LDL Cholesterol: 90 mg/dL (ref 0–99)
Total CHOL/HDL Ratio: 3.7 RATIO
Triglycerides: 92 mg/dL (ref <150)
VLDL: 18 mg/dL (ref 0–40)

## 2020-04-23 LAB — MAGNESIUM: Magnesium: 2.3 mg/dL (ref 1.7–2.4)

## 2020-04-23 LAB — URINE CULTURE: Culture: <10000 — AB

## 2020-04-23 LAB — PHOSPHORUS: Phosphorus: 3.4 mg/dL (ref 2.5–4.6)

## 2020-04-23 MED ORDER — TRAZODONE HCL 50 MG PO TABS
50.0000 mg | ORAL_TABLET | Freq: Once | ORAL | Status: AC
  Administered 2020-04-23: 50 mg via ORAL
  Filled 2020-04-23: qty 1

## 2020-04-23 MED ORDER — CRANBERRY 425 MG PO CAPS: 425.0000 mg | ORAL_CAPSULE | Freq: Two times a day (BID) | ORAL | Status: DC

## 2020-04-23 MED ORDER — OMEGA-3-ACID ETHYL ESTERS 1 G PO CAPS
1000.0000 mg | ORAL_CAPSULE | Freq: Every day | ORAL | Status: DC
  Administered 2020-04-23 – 2020-04-29 (×7): 1000 mg via ORAL
  Filled 2020-04-23 (×8): qty 1

## 2020-04-23 MED ORDER — SIMVASTATIN 20 MG PO TABS: 20.0000 mg | ORAL_TABLET | Freq: Every day | ORAL | Status: DC

## 2020-04-23 MED ORDER — SIMVASTATIN 40 MG PO TABS
40.0000 mg | ORAL_TABLET | Freq: Every day | ORAL | Status: DC
  Administered 2020-04-23 – 2020-04-29 (×7): 40 mg via ORAL
  Filled 2020-04-23 (×7): qty 1

## 2020-04-23 MED ORDER — POTASSIUM CHLORIDE CRYS ER 20 MEQ PO TBCR
50.0000 meq | EXTENDED_RELEASE_TABLET | Freq: Once | ORAL | Status: AC
  Administered 2020-04-23: 50 meq via ORAL
  Filled 2020-04-23: qty 1

## 2020-04-23 MED ORDER — SORBITOL 70 % SOLN
960.0000 mL | TOPICAL_OIL | Freq: Once | ORAL | Status: AC
  Administered 2020-04-23: 960 mL via RECTAL
  Filled 2020-04-23: qty 473

## 2020-04-23 MED ORDER — RISAQUAD PO CAPS
1.0000 | ORAL_CAPSULE | Freq: Two times a day (BID) | ORAL | Status: DC
  Administered 2020-04-23 – 2020-04-29 (×13): 1 via ORAL
  Filled 2020-04-23 (×13): qty 1

## 2020-04-23 NOTE — Progress Notes (Signed)
PROGRESS NOTE    Deanna Morgan  AOZ:[id number] DOB: November 04, 1950 DOA: 04/21/2020 PCP: Florentina Jenny, MD     Brief Narrative:  69 y.o. 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  Was started on Keflex by patient's primary care physician about 3 days ago.  Patient states she has been rubbing her elbow on a chair which she states and noticed swelling around the left elbow and started getting red which started to spread proximally and distally.  Despite taking oral antibiotics patient swelling has got worse and presents to the ER.  ED Course: In the ER patient was afebrile on exam patient does have swelling around the olecranon bursa with redness of the arm and forearm on the left side.  ER physician had discussed with on-call orthopedic surgeon Dr. Victorino Dike who advised at this time IV antibiotics and Ace wrap and follow-up with orthopedics as outpatient in 2 weeks after antibiotics.  Given that patient has failed oral antibiotics and cellulitis is getting worse patient admitted for IV antibiotics.  Labs show hemoglobin of 11.9 Covid test negative.  Since patient has been having some bloating sensation in the abdomen x-ray of the abdomen was done which does not show anything acute.   Subjective: 10/16 afebrile overnight A/O x4, states feels bloated.  Patient's big concern is several outpatient issues such as mammogram and Pap smear which she has missed appointments on, due to difficulty scheduling from SNF.   Assessment & Plan: Covid vaccination;   Principal Problem:   Cellulitis of left upper extremity Active Problems:   Quadriparesis (HCC)   Syringomyelia (HCC)   HLD (hyperlipidemia)    Cellulitis  LEFT arm Cellulitis with Olecranon bursitis -After speaking with patient who is very knowledgeable concerning her body.  Patient fairly certain she is never had MRSA or Pseudomonas, changed antibiotic to cefazolin. -7 to 14-day course cefazolin dependent upon how well patient responds given her debilitated state  Quadriparesis -Baclofen 20 mg TID -MS Contin 15 mg BID -10/16 ordered air mattress  Chronic pain syndrome -See quadriparesis  HLD -10/16 LDL = 90; goal LDL<70 -10/16 increase simvastatin 40 mg daily  Anemia unspecified -10/16 anemia panel pending   Hypokalemia -10/16 K-Dur 50 mEq  Abdominal bloating -10/16 KUB pending; no obstruction moderate stool burden see below -10/16 smog enema    DVT prophylaxis: Subcu heparin Code Status: DNR Family Communication:  Status is: Inpatient    Dispo: The patient is from: SNF              Anticipated d/c is to: SNF              Anticipated d/c date is:??              Patient currently unstable      Consultants:    Procedures/Significant Events:  10/16 KUB; moderate colonic stool burden without evidence of obstruction    I have personally reviewed and interpreted all radiology studies and my findings are as above.  VENTILATOR SETTINGS:    Cultures   Antimicrobials: Anti-infectives (From admission, onward)   Start     Ordered Stop   04/22/20 1430  ceFAZolin (ANCEF) IVPB 2g/100 mL premix        04/22/20 1338  04/22/20 1100  vancomycin (VANCOCIN) IVPB 1000 mg/200 mL premix  Status:  Discontinued        04/21/20 2209 04/22/20 1328   04/22/20 0600  ceFEPIme (MAXIPIME) 2 g in sodium chloride 0.9 % 100 mL IVPB  Status:  Discontinued        04/21/20 2209 04/22/20 1328   04/21/20 2230  vancomycin (VANCOREADY) IVPB 1500 mg/300 mL        04/21/20 2158 04/22/20 0225   04/21/20 2200  ceFEPIme (MAXIPIME) 2 g in sodium chloride 0.9 % 100 mL IVPB        04/21/20 2158 04/21/20 2336   04/21/20 2100   cefTRIAXone (ROCEPHIN) 1 g in sodium chloride 0.9 % 100 mL IVPB        04/21/20 2049 04/21/20 2201       Devices    LINES / TUBES:      Continuous Infusions: . sodium chloride 10 mL/hr at 04/23/20 0846  .  ceFAZolin (ANCEF) IV Stopped (04/23/20 0554)     Objective: Vitals:   04/22/20 0755 04/22/20 1505 04/22/20 2006 04/23/20 0516  BP: (!) 165/89 130/81 116/67 131/66  Pulse: 72 67 74 63  Resp: 20 20 17 19   Temp: 98.2 F (36.8 C) 98.2 F (36.8 C) (!) 97.5 F (36.4 C) 97.9 F (36.6 C)  TempSrc: Oral Oral Oral Oral  SpO2: 94% 96% 96% 93%  Weight:      Height:        Intake/Output Summary (Last 24 hours) at 04/23/2020 0859 Last data filed at 04/23/2020 0848 Gross per 24 hour  Intake 1272.33 ml  Output 3700 ml  Net -2427.67 ml   Filed Weights   04/21/20 1914  Weight: 70.3 kg   Physical Exam:  General: A/O x4, No acute respiratory distress Eyes: negative scleral hemorrhage, negative anisocoria, negative icterus ENT: Negative Runny nose, negative gingival bleeding, Neck:  Negative scars, masses, torticollis, lymphadenopathy, JVD Lungs: Clear to auscultation bilaterally without wheezes or crackles Cardiovascular: Regular rate and rhythm without murmur gallop or rub normal S1 and S2 Abdomen: negative abdominal pain, positive distention, positive soft, bowel sounds, no rebound, no ascites, no appreciable mass Extremities: No significant cyanosis, clubbing, or edema bilateral lower extremities Skin: Negative rashes, lesions, ulcers Psychiatric:  Negative depression, negative anxiety, negative fatigue, negative mania  Central nervous system:  Cranial nerves II through XII intact, tongue/uvula midline, upper extremities muscle strength 3-4 /5 (with contraction and loss of fine motor skill), lower extremity muscle strength 0/5, sensation intact throughout, negative dysarthria, negative expressive aphasia, negative receptive aphasia. .     Data Reviewed: Care during  the described time interval was provided by me .  I have reviewed this patient's available data, including medical history, events of note, physical examination, and all test results as part of my evaluation.  CBC: Recent Labs  Lab 04/21/20 1903 04/22/20 0500 04/23/20 0342  WBC 6.2 7.3 4.3  NEUTROABS 4.5  --  2.6  HGB 11.9* 13.2 11.6*  HCT 37.7 41.0 36.9  MCV 91.3 91.9 90.9  PLT 205 232 224   Basic Metabolic Panel: Recent Labs  Lab 04/21/20 1903 04/22/20 0500 04/23/20 0342  NA 136 137 138  K 4.2 3.7 3.4*  CL 100 101 102  CO2 27 23 26   GLUCOSE 126* 118* 101*  BUN 19 15 14   CREATININE 0.65 0.56 0.59  CALCIUM 8.7* 9.0 8.8*  MG  --   --  2.3  PHOS  --   --  3.4  GFR: Estimated Creatinine Clearance: 61.6 mL/min (by C-G formula based on SCr of 0.59 mg/dL). Liver Function Tests: Recent Labs  Lab 04/22/20 0500 04/23/20 0342  AST 17 17  ALT 24 20  ALKPHOS 70 56  BILITOT 0.9 0.5  PROT 7.8 6.8  ALBUMIN 4.0 3.4*   No results for input(s): LIPASE, AMYLASE in the last 168 hours. No results for input(s): AMMONIA in the last 168 hours. Coagulation Profile: No results for input(s): INR, PROTIME in the last 168 hours. Cardiac Enzymes: No results for input(s): CKTOTAL, CKMB, CKMBINDEX, TROPONINI in the last 168 hours. BNP (last 3 results) No results for input(s): PROBNP in the last 8760 hours. HbA1C: No results for input(s): HGBA1C in the last 72 hours. CBG: No results for input(s): GLUCAP in the last 168 hours. Lipid Profile: Recent Labs    04/23/20 0342  CHOL 148  HDL 40*  LDLCALC 90  TRIG 92  CHOLHDL 3.7   Thyroid Function Tests: No results for input(s): TSH, T4TOTAL, FREET4, T3FREE, THYROIDAB in the last 72 hours. Anemia Panel: No results for input(s): VITAMINB12, FOLATE, FERRITIN, TIBC, IRON, RETICCTPCT in the last 72 hours. Sepsis Labs: Recent Labs  Lab 04/21/20 1903  LATICACIDVEN 1.0    Recent Results (from the past 240 hour(s))  Culture, blood  (routine x 2)     Status: None (Preliminary result)   Collection Time: 04/21/20  7:02 PM   Specimen: BLOOD  Result Value Ref Range Status   Specimen Description   Final    BLOOD RIGHT Performed at [facility], [address] [place], [place] [address]    Special Requests   Final    BOTTLES DRAWN AEROBIC AND ANAEROBIC Blood Culture results may not be optimal due to an inadequate volume of blood received in culture bottles Performed at [facility], [address], [place], [place] [address]    Culture   Final    NO GROWTH 2 DAYS Performed at [facility], [address], [place], [place] [address]    Report Status PENDING  Incomplete  Culture, blood (routine x 2)     Status: None (Preliminary result)   Collection Time: 04/21/20  7:07 PM   Specimen: BLOOD  Result Value Ref Range Status   Specimen Description   Final    BLOOD LEFT Performed at [facility], [address], [place], [place] [address]    Special Requests   Final    BOTTLES DRAWN AEROBIC AND ANAEROBIC Blood Culture results may not be optimal due to an inadequate volume of blood received in culture bottles Performed at [facility], [address], [place], [place] [address]    Culture   Final    NO GROWTH 2 DAYS Performed at [facility], [address], [place], [place] [address]    Report Status PENDING  Incomplete  Respiratory Panel by RT PCR (Flu A&B, Covid) - Nasopharyngeal Swab     Status: None   Collection Time: 04/21/20 10:00 PM   Specimen: Nasopharyngeal Swab  Result Value Ref Range Status   SARS Coronavirus 2 by RT PCR NEGATIVE NEGATIVE Final    Comment: (NOTE) SARS-CoV-2 target nucleic acids are NOT DETECTED.  The SARS-CoV-2 RNA is generally detectable in upper respiratoy specimens during the acute phase of infection. The lowest concentration of SARS-CoV-2 viral copies this assay can detect is 131 copies/mL. A  negative result does not preclude SARS-Cov-2 infection and should not be  used as the sole basis for treatment or other patient management decisions. A negative result may occur with  improper specimen collection/handling, submission of specimen other than nasopharyngeal swab, presence of viral mutation(s) within the areas targeted by this assay, and inadequate number of viral copies (<131 copies/mL). A negative result must be combined with clinical observations, patient history, and epidemiological information. The expected result is Negative.  Fact Sheet for Patients:  https://www.moore.com/  Fact Sheet for Healthcare Providers:  https://www.young.biz/  This test is no t yet approved or cleared by the [place] FDA and  has been authorized for detection and/or diagnosis of SARS-CoV-2 by FDA under an Emergency Use Authorization (EUA). This EUA will remain  in effect (meaning this test can be used) for the duration of the COVID-19 declaration under Section 564(b)(1) of the Act, 21 U.S.C. section 360bbb-3(b)(1), unless the authorization is terminated or revoked sooner.     Influenza A by PCR NEGATIVE NEGATIVE Final   Influenza B by PCR NEGATIVE NEGATIVE Final    Comment: (NOTE) The Xpert Xpress SARS-CoV-2/FLU/RSV assay is intended as an aid in  the diagnosis of influenza from Nasopharyngeal swab specimens and  should not be used as a sole basis for treatment. Nasal washings and  aspirates are unacceptable for Xpert Xpress SARS-CoV-2/FLU/RSV  testing.  Fact Sheet for Patients: https://www.moore.com/  Fact Sheet for Healthcare Providers: https://www.young.biz/  This test is not yet approved or cleared by the [place] FDA and  has been authorized for detection and/or diagnosis of SARS-CoV-2 by  FDA under an Emergency Use Authorization (EUA). This EUA will remain  in effect (meaning this test can  be used) for the duration of the  Covid-19 declaration under Section 564(b)(1) of the Act, 21  U.S.C. section 360bbb-3(b)(1), unless the authorization is  terminated or revoked. Performed at [facility], [address], [place], [place] [address]   Urine culture     Status: Abnormal   Collection Time: 04/21/20 11:54 PM   Specimen: Urine, Random  Result Value Ref Range Status   Specimen Description   Final    URINE, RANDOM Performed at [facility], [address] [place], [place] [address]    Special Requests   Final    NONE Performed at [facility], [address], [place], [place] [address]    Culture (A)  Final    <10,000 COLONIES/mL INSIGNIFICANT GROWTH Performed at [facility], [address], [place], [place] [address]    Report Status 04/23/2020 FINAL  Final         Radiology Studies: DG Abd 1 View  Result Date: 04/22/2020 CLINICAL DATA:  Abdominal distension. EXAM: ABDOMEN - 1 VIEW COMPARISON:  Acute abdominal series 09/27/2016 FINDINGS: Bowel gas pattern is normal. Gas is present stomach. Small bowel and colon are unremarkable. Definite free air is present. Rightward curvature of the lumbar spine is again noted. Degenerative changes are present in the hips. IMPRESSION: 1. No acute abnormality or significant interval change. 2. Scoliosis. Electronically Signed   By: Marin Roberts M.D.   On: 04/22/2020 04:11        Scheduled Meds: . vitamin C  1,000 mg Oral Daily  . aspirin  81 mg Oral Daily  . baclofen  20 mg Oral TID  . bisacodyl  10 mg Rectal Once per day on Mon Wed Fri  . Chlorhexidine Gluconate Cloth  6 each Topical Daily  . docusate sodium  100 mg Oral  BID  . heparin injection (subcutaneous)  5,000 Units Subcutaneous Q8H  . levothyroxine  12.5 mcg Oral Q0600  . morphine  15 mg Oral Q12H  . neomycin-bacitracin-polymyxin  1 application Topical Daily  . pantoprazole  40 mg Oral  Q0600  . polyethylene glycol  17 g Oral BID  . senna  2 tablet Oral QHS   Continuous Infusions: . sodium chloride 10 mL/hr at 04/23/20 0846  .  ceFAZolin (ANCEF) IV Stopped (04/23/20 0554)     LOS: 2 days    Time spent:40 min    Elfreda Blanchet, Roselind Messier, MD [facility] Pager [phone]  If 7PM-7AM, please contact night-coverage [email or web address] Password [email or web address] 04/23/2020, 8:59 AM

## 2020-04-23 NOTE — Plan of Care (Signed)
  Problem: Education: Goal: Knowledge of General Education information will improve Description: Including pain rating scale, medication(s)/side effects and non-pharmacologic comfort measures Outcome: Progressing   Problem: Pain Managment: Goal: General experience of comfort will improve Outcome: Progressing   

## 2020-04-23 NOTE — Plan of Care (Signed)
  Problem: Clinical Measurements: Goal: Ability to maintain clinical measurements within normal limits will improve 04/23/2020 0941 by Sherren Kerns, RN Outcome: Progressing 04/23/2020 0941 by Sherren Kerns, RN Outcome: Progressing   Problem: Clinical Measurements: Goal: Will remain free from infection 04/23/2020 0941 by Sherren Kerns, RN Outcome: Progressing 04/23/2020 0941 by Sherren Kerns, RN Outcome: Progressing   Problem: Clinical Measurements: Goal: Diagnostic test results will improve 04/23/2020 0941 by Sherren Kerns, RN Outcome: Progressing 04/23/2020 0941 by Sherren Kerns, RN Outcome: Progressing   Problem: Clinical Measurements: Goal: Respiratory complications will improve 04/23/2020 0941 by Sherren Kerns, RN Outcome: Progressing 04/23/2020 0941 by Sherren Kerns, RN Outcome: Progressing   Problem: Coping: Goal: Level of anxiety will decrease 04/23/2020 0941 by Sherren Kerns, RN Outcome: Progressing 04/23/2020 0941 by Sherren Kerns, RN Outcome: Progressing   Problem: Elimination: Goal: Will not experience complications related to bowel motility 04/23/2020 0941 by Sherren Kerns, RN Outcome: Progressing 04/23/2020 0941 by Sherren Kerns, RN Outcome: Progressing   Problem: Pain Managment: Goal: General experience of comfort will improve 04/23/2020 0941 by Sherren Kerns, RN Outcome: Progressing 04/23/2020 0941 by Sherren Kerns, RN Outcome: Progressing

## 2020-04-23 NOTE — Progress Notes (Signed)
Pt c/o 8/10 abdominal pain. She has had two bowel movements today. Her bowel sounds are normal times four. Belly is distended but soft. She reports she feels bloated. Md notified for prn request.

## 2020-04-23 NOTE — Progress Notes (Signed)
RN states cellulitis is improving  NAD, A+O x3 LLE: (+) erythema and warmth over proximal extensors. Skin intact. No fluctuance or induration.  No significant TTP. Mild swelling olecranon bursa. 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 Hand perfused.  A/P: LUE cellulitis, improving No surgical indication Continue IV abx  Jonette Pesa, MD 04/23/20

## 2020-04-24 LAB — COMPREHENSIVE METABOLIC PANEL
ALT: 11 U/L (ref 0–44)
AST: 13 U/L — ABNORMAL LOW (ref 15–41)
Albumin: 3.2 g/dL — ABNORMAL LOW (ref 3.5–5.0)
Alkaline Phosphatase: 52 U/L (ref 38–126)
Anion gap: 10 (ref 5–15)
BUN: 17 mg/dL (ref 8–23)
CO2: 26 mmol/L (ref 22–32)
Calcium: 8.7 mg/dL — ABNORMAL LOW (ref 8.9–10.3)
Chloride: 104 mmol/L (ref 98–111)
Creatinine, Ser: 0.72 mg/dL (ref 0.44–1.00)
GFR, Estimated: >60 mL/min (ref >60)
Glucose, Bld: 114 mg/dL — ABNORMAL HIGH (ref 70–99)
Potassium: 4.1 mmol/L (ref 3.5–5.1)
Sodium: 140 mmol/L (ref 135–145)
Total Bilirubin: 0.7 mg/dL (ref 0.3–1.2)
Total Protein: 6.1 g/dL — ABNORMAL LOW (ref 6.5–8.1)

## 2020-04-24 LAB — CBC WITH DIFFERENTIAL/PLATELET
Abs Immature Granulocytes: 0.01 10*3/uL (ref 0.00–0.07)
Basophils Absolute: 0 10*3/uL (ref 0.0–0.1)
Basophils Relative: 1 %
Eosinophils Absolute: 0.2 10*3/uL (ref 0.0–0.5)
Eosinophils Relative: 3 %
HCT: 33.6 % — ABNORMAL LOW (ref 36.0–46.0)
Hemoglobin: 10.8 g/dL — ABNORMAL LOW (ref 12.0–15.0)
Immature Granulocytes: 0 %
Lymphocytes Relative: 30 %
Lymphs Abs: 1.3 10*3/uL (ref 0.7–4.0)
MCH: 29.4 pg (ref 26.0–34.0)
MCHC: 32.1 g/dL (ref 30.0–36.0)
MCV: 91.6 fL (ref 80.0–100.0)
Monocytes Absolute: 0.5 10*3/uL (ref 0.1–1.0)
Monocytes Relative: 10 %
Neutro Abs: 2.4 10*3/uL (ref 1.7–7.7)
Neutrophils Relative %: 56 %
Platelets: 217 10*3/uL (ref 150–400)
RBC: 3.67 MIL/uL — ABNORMAL LOW (ref 3.87–5.11)
RDW: 13.3 % (ref 11.5–15.5)
WBC: 4.4 10*3/uL (ref 4.0–10.5)
nRBC: 0 % (ref 0.0–0.2)

## 2020-04-24 LAB — RETICULOCYTES
Immature Retic Fract: 12.9 % (ref 2.3–15.9)
RBC.: 3.68 MIL/uL — ABNORMAL LOW (ref 3.87–5.11)
Retic Count, Absolute: 63.7 10*3/uL (ref 19.0–186.0)
Retic Ct Pct: 1.7 % (ref 0.4–3.1)

## 2020-04-24 LAB — MAGNESIUM: Magnesium: 2.5 mg/dL — ABNORMAL HIGH (ref 1.7–2.4)

## 2020-04-24 LAB — IRON AND TIBC
Iron: 60 ug/dL (ref 28–170)
Saturation Ratios: 20 % (ref 10.4–31.8)
TIBC: 304 ug/dL (ref 250–450)
UIBC: 244 ug/dL

## 2020-04-24 LAB — PHOSPHORUS: Phosphorus: 4.6 mg/dL (ref 2.5–4.6)

## 2020-04-24 LAB — FOLATE: Folate: 16.4 ng/mL (ref >5.9)

## 2020-04-24 LAB — FERRITIN: Ferritin: 52 ng/mL (ref 11–307)

## 2020-04-24 LAB — VITAMIN B12: Vitamin B-12: 332 pg/mL (ref 180–914)

## 2020-04-24 MED ORDER — TRAZODONE HCL 50 MG PO TABS
50.0000 mg | ORAL_TABLET | Freq: Every evening | ORAL | Status: DC | PRN
  Administered 2020-04-24 – 2020-04-28 (×5): 50 mg via ORAL
  Filled 2020-04-24 (×5): qty 1

## 2020-04-24 MED ORDER — MENTHOL 3 MG MT LOZG
1.0000 | LOZENGE | OROMUCOSAL | Status: DC | PRN
  Administered 2020-04-28: 3 mg via ORAL
  Filled 2020-04-24: qty 9

## 2020-04-24 NOTE — Plan of Care (Signed)
  Problem: Education: Goal: Knowledge of General Education information will improve Description: Including pain rating scale, medication(s)/side effects and non-pharmacologic comfort measures Outcome: Progressing   Problem: Health Behavior/Discharge Planning: Goal: Ability to manage health-related needs will improve Outcome: Progressing   Problem: Clinical Measurements: Goal: Ability to maintain clinical measurements within normal limits will improve Outcome: Progressing Goal: Will remain free from infection Outcome: Progressing Goal: Diagnostic test results will improve Outcome: Progressing Goal: Respiratory complications will improve Outcome: Progressing Goal: Cardiovascular complication will be avoided Outcome: Progressing   Problem: Activity: Goal: Risk for activity intolerance will decrease Outcome: Progressing   Problem: Coping: Goal: Level of anxiety will decrease Outcome: Progressing   Problem: Elimination: Goal: Will not experience complications related to bowel motility Outcome: Progressing Goal: Will not experience complications related to urinary retention Outcome: Progressing   Problem: Pain Managment: Goal: General experience of comfort will improve Outcome: Progressing   

## 2020-04-24 NOTE — Plan of Care (Signed)
  Problem: Clinical Measurements: Goal: Ability to maintain clinical measurements within normal limits will improve Outcome: Progressing   Problem: Clinical Measurements: Goal: Respiratory complications will improve Outcome: Progressing   Problem: Clinical Measurements: Goal: Cardiovascular complication will be avoided Outcome: Progressing   Problem: Activity: Goal: Risk for activity intolerance will decrease Outcome: Progressing   Problem: Pain Managment: Goal: General experience of comfort will improve Outcome: Progressing   

## 2020-04-24 NOTE — Progress Notes (Signed)
     Subjective:      Patient reports pain as mild.  States cellulitis is improving, and feeling much better.  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    Objective:   VITALS:   Vitals:   04/23/20 2021 04/24/20 0503  BP: 135/82 (!) 93/47  Pulse: 75 65  Resp: 16 16  Temp: 98.1 F (36.7 C) 98.9 F (37.2 C)  SpO2: 96% 93%    A+O x3 LUE cellulitis, improving Difficult to appreciate erythema and warmth.  Skin intact.  No fluctuance or induration.   No significant TTP.  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  LABS Recent Labs    04/22/20 0500 04/23/20 0342 04/24/20 0334  HGB 13.2 11.6* 10.8*  HCT 41.0 36.9 33.6*  WBC 7.3 4.3 4.4  PLT 232 224 217    Recent Labs    04/22/20 0500 04/23/20 0342 04/24/20 0334  NA 137 138 140  K 3.7 3.4* 4.1  BUN 15 14 17   CREATININE 0.56 0.59 0.72  GLUCOSE 118* 101* 114*     Assessment/Plan: LUE cellulitis, improving  No surgical indication Continue IV abx            PA-C  [facility]  [facility] [place] [address], [place], [place] Phone: [phone] [email or web address] Facebook  [phone]
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
## 2020-04-25 DIAGNOSIS — I10 Essential (primary) hypertension: Secondary | ICD-10-CM | POA: Diagnosis present

## 2020-04-25 DIAGNOSIS — D649 Anemia, unspecified: Secondary | ICD-10-CM | POA: Diagnosis present

## 2020-04-25 DIAGNOSIS — I5032 Chronic diastolic (congestive) heart failure: Secondary | ICD-10-CM | POA: Diagnosis present

## 2020-04-25 LAB — CBC WITH DIFFERENTIAL/PLATELET
Abs Immature Granulocytes: 0.01 10*3/uL (ref 0.00–0.07)
Basophils Absolute: 0 10*3/uL (ref 0.0–0.1)
Basophils Relative: 1 %
Eosinophils Absolute: 0.2 10*3/uL (ref 0.0–0.5)
Eosinophils Relative: 4 %
HCT: 35.3 % — ABNORMAL LOW (ref 36.0–46.0)
Hemoglobin: 11.2 g/dL — ABNORMAL LOW (ref 12.0–15.0)
Immature Granulocytes: 0 %
Lymphocytes Relative: 26 %
Lymphs Abs: 1.1 10*3/uL (ref 0.7–4.0)
MCH: 29.3 pg (ref 26.0–34.0)
MCHC: 31.7 g/dL (ref 30.0–36.0)
MCV: 92.4 fL (ref 80.0–100.0)
Monocytes Absolute: 0.5 10*3/uL (ref 0.1–1.0)
Monocytes Relative: 11 %
Neutro Abs: 2.5 10*3/uL (ref 1.7–7.7)
Neutrophils Relative %: 58 %
Platelets: 214 10*3/uL (ref 150–400)
RBC: 3.82 MIL/uL — ABNORMAL LOW (ref 3.87–5.11)
RDW: 13.4 % (ref 11.5–15.5)
WBC: 4.3 10*3/uL (ref 4.0–10.5)
nRBC: 0 % (ref 0.0–0.2)

## 2020-04-25 LAB — COMPREHENSIVE METABOLIC PANEL
ALT: 17 U/L (ref 0–44)
AST: 35 U/L (ref 15–41)
Albumin: 3.2 g/dL — ABNORMAL LOW (ref 3.5–5.0)
Alkaline Phosphatase: 61 U/L (ref 38–126)
Anion gap: 9 (ref 5–15)
BUN: 18 mg/dL (ref 8–23)
CO2: 27 mmol/L (ref 22–32)
Calcium: 8.6 mg/dL — ABNORMAL LOW (ref 8.9–10.3)
Chloride: 104 mmol/L (ref 98–111)
Creatinine, Ser: 0.67 mg/dL (ref 0.44–1.00)
GFR, Estimated: >60 mL/min (ref >60)
Glucose, Bld: 127 mg/dL — ABNORMAL HIGH (ref 70–99)
Potassium: 4.1 mmol/L (ref 3.5–5.1)
Sodium: 140 mmol/L (ref 135–145)
Total Bilirubin: 0.5 mg/dL (ref 0.3–1.2)
Total Protein: 6.5 g/dL (ref 6.5–8.1)

## 2020-04-25 LAB — PHOSPHORUS: Phosphorus: 4.3 mg/dL (ref 2.5–4.6)

## 2020-04-25 LAB — MAGNESIUM: Magnesium: 2.4 mg/dL (ref 1.7–2.4)

## 2020-04-25 MED ORDER — MORPHINE SULFATE ER 15 MG PO TBCR
15.0000 mg | EXTENDED_RELEASE_TABLET | ORAL | Status: DC
  Administered 2020-04-25 – 2020-04-28 (×4): 15 mg via ORAL
  Filled 2020-04-25 (×4): qty 1

## 2020-04-25 MED ORDER — LISINOPRIL 5 MG PO TABS
2.5000 mg | ORAL_TABLET | Freq: Every day | ORAL | Status: DC
  Administered 2020-04-25 – 2020-04-26 (×2): 2.5 mg via ORAL
  Filled 2020-04-25: qty 1

## 2020-04-25 MED ORDER — SORBITOL 70 % SOLN
960.0000 mL | TOPICAL_OIL | Freq: Once | ORAL | Status: AC
  Administered 2020-04-25: 960 mL via RECTAL
  Filled 2020-04-25: qty 473

## 2020-04-25 MED ORDER — MORPHINE SULFATE 15 MG PO TABS
7.5000 mg | ORAL_TABLET | ORAL | Status: DC
  Administered 2020-04-26 – 2020-04-29 (×4): 7.5 mg via ORAL
  Filled 2020-04-25 (×4): qty 1

## 2020-04-25 NOTE — Plan of Care (Signed)
  Problem: Education: Goal: Knowledge of General Education information will improve Description: Including pain rating scale, medication(s)/side effects and non-pharmacologic comfort measures Outcome: Progressing   Problem: Health Behavior/Discharge Planning: Goal: Ability to manage health-related needs will improve Outcome: Progressing   Problem: Clinical Measurements: Goal: Ability to maintain clinical measurements within normal limits will improve Outcome: Progressing Goal: Will remain free from infection Outcome: Progressing Goal: Diagnostic test results will improve Outcome: Progressing Goal: Respiratory complications will improve Outcome: Progressing Goal: Cardiovascular complication will be avoided Outcome: Progressing   Problem: Activity: Goal: Risk for activity intolerance will decrease Outcome: Progressing   Problem: Coping: Goal: Level of anxiety will decrease Outcome: Progressing   Problem: Elimination: Goal: Will not experience complications related to bowel motility Outcome: Progressing Goal: Will not experience complications related to urinary retention Outcome: Progressing   Problem: Pain Managment: Goal: General experience of comfort will improve Outcome: Progressing   

## 2020-04-25 NOTE — Progress Notes (Signed)
PROGRESS NOTE    Deanna Morgan  GQQ:[id number] DOB: 1950/12/30 DOA: 04/21/2020 PCP: Florentina Jenny, MD     Brief Narrative:  69 y.o. 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  Was started on Keflex by patient's primary care physician about 3 days ago.  Patient states she has been rubbing her elbow on a chair which she states and noticed swelling around the left elbow and started getting red which started to spread proximally and distally.  Despite taking oral antibiotics patient swelling has got worse and presents to the ER.  ED Course: In the ER patient was afebrile on exam patient does have swelling around the olecranon bursa with redness of the arm and forearm on the left side.  ER physician had discussed with on-call orthopedic surgeon Dr. Victorino Dike who advised at this time IV antibiotics and Ace wrap and follow-up with orthopedics as outpatient in 2 weeks after antibiotics.  Given that patient has failed oral antibiotics and cellulitis is getting worse patient admitted for IV antibiotics.  Labs show hemoglobin of 11.9 Covid test negative.  Since patient has been having some bloating sensation in the abdomen x-ray of the abdomen was done which does not show anything acute.   Subjective: 10/18 afebrile overnight, A/O x4, patient's biggest concern today is she wants to be titrated off of her narcotics.  Patient has been on narcotics for at least 30 years.    Assessment & Plan: Covid vaccination; vaccinated   Principal Problem:   Cellulitis of left upper extremity Active Problems:   Quadriparesis (HCC)   Syringomyelia (HCC)   HLD (hyperlipidemia)   Cellulitis  LEFT arm  Cellulitis with Olecranon bursitis -After speaking with patient who is very knowledgeable concerning her body.  Patient fairly certain she is never had MRSA or Pseudomonas, changed antibiotic to cefazolin. -7 to 14-day course cefazolin dependent upon how well patient responds given her debilitated state  Quadriparesis -Baclofen 20 mg TID -10/16 ordered air mattress -10/18 patient wants to start reducing her narcotic usage.  I counseled her at length that this will be a lengthy process given her length of use (greater than 30 years), however we could start here in the hospital but would need to be continued as an outpatient. -10/18 decrease MS Contin 15 mg 2200 -10/18 MSIR 7.5 mg @1000   Chronic pain syndrome -See quadriparesis  Chronic diastolic CHF -Unable to place patient on beta-blocker secondary to borderline bradycardia. -10/18 lisinopril 2.5 mg -Strict ins and outs -Daily weight  Essential HTN -See CHF  HLD -10/16 LDL = 90; goal LDL<70 -10/16 increase simvastatin 40 mg daily  Normocytic Anemia  -10/16 anemia panel consistent with normocytic anemia  Hypokalemia -Resolved  Abdominal bloating -10/16 KUB pending; no obstruction moderate stool burden see below -10/16 smog enema -10/17 still some bloating but not as tight and patient much more comfortable. -10/18 repeat smog enema.  Obtain KUB in the a.m.   DVT prophylaxis: Subcu heparin Code Status: DNR Family Communication:  Status is: Inpatient    Dispo: The patient is from: SNF              Anticipated d/c is to: SNF  Anticipated d/c date is:??              Patient currently unstable      Consultants:    Procedures/Significant Events:  10/16 KUB; moderate colonic stool burden without evidence of obstruction    I have personally reviewed and interpreted all radiology studies and my findings are as above.  VENTILATOR SETTINGS:    Cultures   Antimicrobials: Anti-infectives (From  admission, onward)   Start     Ordered Stop   04/22/20 1430  ceFAZolin (ANCEF) IVPB 2g/100 mL premix        04/22/20 1338     04/22/20 1100  vancomycin (VANCOCIN) IVPB 1000 mg/200 mL premix  Status:  Discontinued        04/21/20 2209 04/22/20 1328   04/22/20 0600  ceFEPIme (MAXIPIME) 2 g in sodium chloride 0.9 % 100 mL IVPB  Status:  Discontinued        04/21/20 2209 04/22/20 1328   04/21/20 2230  vancomycin (VANCOREADY) IVPB 1500 mg/300 mL        04/21/20 2158 04/22/20 0225   04/21/20 2200  ceFEPIme (MAXIPIME) 2 g in sodium chloride 0.9 % 100 mL IVPB        04/21/20 2158 04/21/20 2336   04/21/20 2100  cefTRIAXone (ROCEPHIN) 1 g in sodium chloride 0.9 % 100 mL IVPB        04/21/20 2049 04/21/20 2201       Devices    LINES / TUBES:      Continuous Infusions: . sodium chloride 10 mL/hr at 04/25/20 0200  .  ceFAZolin (ANCEF) IV 2 g (04/25/20 1419)     Objective: Vitals:   04/24/20 2103 04/25/20 0500 04/25/20 0535 04/25/20 1323  BP: (!) 156/71  (!) 130/58 (!) 145/76  Pulse: 76  69 66  Resp: 18  18 18   Temp: 98 F (36.7 C)  98.5 F (36.9 C) 98 F (36.7 C)  TempSrc:    Oral  SpO2: 93%  91% 94%  Weight:  74.9 kg    Height:        Intake/Output Summary (Last 24 hours) at 04/25/2020 1518 Last data filed at 04/25/2020 0535 Gross per 24 hour  Intake 815.65 ml  Output 1800 ml  Net -984.35 ml   Filed Weights   04/21/20 1914 04/25/20 0500  Weight: 70.3 kg 74.9 kg   Physical Exam:  General: A/O x4, No acute respiratory distress Eyes: negative scleral hemorrhage, negative anisocoria, negative icterus ENT: Negative Runny nose, negative gingival bleeding, Neck:  Negative scars, masses, torticollis, lymphadenopathy, JVD Lungs: Clear to auscultation bilaterally without wheezes or crackles Cardiovascular: Regular rate and rhythm without murmur gallop or rub normal S1 and S2 Abdomen: negative abdominal pain, positive distention but less tender and tight, positive soft,  bowel sounds, no rebound, no ascites, no appreciable mass Extremities: No significant cyanosis, clubbing, or edema bilateral lower extremities Skin: Negative rashes, lesions, ulcers Psychiatric:  Negative depression, negative anxiety, negative fatigue, negative mania  Central nervous system:  Cranial nerves II through XII intact, tongue/uvula midline, upper extremities muscle strength 3-4 /5 (with contraction and loss of fine motor skill), lower extremity muscle strength 0/5, sensation intact throughout, negative dysarthria, negative expressive aphasia, negative receptive aphasia. .     Data Reviewed: Care during the described time interval was provided by me .  I have reviewed this tight available data, including medical history, events of note, physical examination, and all test results as part of my evaluation.  CBC: Recent Labs  Lab 04/21/20 1903 04/22/20 0500 04/23/20 0342 04/24/20 0334 04/25/20 0331  WBC 6.2 7.3 4.3 4.4 4.3  NEUTROABS 4.5  --  2.6 2.4 2.5  HGB 11.9* 13.2 11.6* 10.8* 11.2*  HCT 37.7 41.0 36.9 33.6* 35.3*  MCV 91.3 91.9 90.9 91.6 92.4  PLT 205 232 224 217 214   Basic Metabolic Panel: Recent Labs  Lab 04/21/20 1903 04/22/20 0500 04/23/20 0342 04/24/20 0334 04/25/20 0331  NA 136 137 138 140 140  K 4.2 3.7 3.4* 4.1 4.1  CL 100 101 102 104 104  CO2 GLUCOSE 126* 118* 101* 114* 127*  BUN CREATININE 0.65 0.56 0.59 0.72 0.67  CALCIUM 8.7* 9.0 8.8* 8.7* 8.6*  MG  --   --  2.3 2.5* 2.4  PHOS  --   --  3.4 4.6 4.3   GFR: Estimated Creatinine Clearance: 63.4 mL/min (by C-G formula based on SCr of 0.67 mg/dL). Liver Function Tests: Recent Labs  Lab 04/22/20 0500 04/23/20 0342 04/24/20 0334 04/25/20 0331  AST 17 17 13* 35  ALT ALKPHOS 70 56 52 61  BILITOT 0.9 0.5 0.7 0.5  PROT 7.8 6.8 6.1* 6.5  ALBUMIN 4.0 3.4* 3.2* 3.2*   No results for input(s): LIPASE, AMYLASE in the last 168 hours. No results for  input(s): AMMONIA in the last 168 hours. Coagulation Profile: No results for input(s): INR, PROTIME in the last 168 hours. Cardiac Enzymes: No results for input(s): CKTOTAL, CKMB, CKMBINDEX, TROPONINI in the last 168 hours. BNP (last 3 results) No results for input(s): PROBNP in the last 8760 hours. HbA1C: No results for input(s): HGBA1C in the last 72 hours. CBG: No results for input(s): GLUCAP in the last 168 hours. Lipid Profile: Recent Labs    04/23/20 0342  CHOL 148  HDL 40*  LDLCALC 90  TRIG 92  CHOLHDL 3.7   Thyroid Function Tests: No results for input(s): TSH, T4TOTAL, FREET4, T3FREE, THYROIDAB in the last 72 hours. Anemia Panel: Recent Labs    04/24/20 0334  VITAMINB12 332  FOLATE 16.4  FERRITIN 52  TIBC 304  IRON 60  RETICCTPCT 1.7   Sepsis Labs: Recent Labs  Lab 04/21/20 1903  LATICACIDVEN 1.0    Recent Results (from the past 240 hour(s))  Culture, blood (routine x 2)     Status: None (Preliminary result)   Collection Time: 04/21/20  7:02 PM   Specimen: BLOOD  Result Value Ref Range Status   Specimen Description   Final    BLOOD RIGHT Performed at [facility], [address] [place], [place] [address]    Special Requests   Final    BOTTLES DRAWN AEROBIC AND ANAEROBIC Blood Culture results may not be optimal due to an inadequate volume of blood received in culture bottles Performed at [facility], [address] [place], [place] [address]    Culture   Final    NO GROWTH 4 DAYS Performed at [facility], [address], [place], [place] [address]    Report Status PENDING  Incomplete  Culture, blood (routine x 2)     Status: None (Preliminary result)   Collection Time: 04/21/20  7:07 PM   Specimen: BLOOD  Result Value Ref Range Status   Specimen Description   Final    BLOOD LEFT Performed at [facility], [address] [place], [place]  [address]    Special Requests   Final      BOTTLES DRAWN AEROBIC AND ANAEROBIC Blood Culture results may not be optimal due to an inadequate volume of blood received in culture bottles Performed at [facility], [address] [place], [place] [address]    Culture   Final    NO GROWTH 4 DAYS Performed at [facility], [address] [place], [place] [address]    Report Status PENDING  Incomplete  Respiratory Panel by RT PCR (Flu A&B, Covid) - Nasopharyngeal Swab     Status: None   Collection Time: 04/21/20 10:00 PM   Specimen: Nasopharyngeal Swab  Result Value Ref Range Status   SARS Coronavirus 2 by RT PCR NEGATIVE NEGATIVE Final    Comment: (NOTE) SARS-CoV-2 target nucleic acids are NOT DETECTED.  The SARS-CoV-2 RNA is generally detectable in upper respiratoy specimens during the acute phase of infection. The lowest concentration of SARS-CoV-2 viral copies this assay can detect is 131 copies/mL. A negative result does not preclude SARS-Cov-2 infection and should not be used as the sole basis for treatment or other patient management decisions. A negative result may occur with  improper specimen collection/handling, submission of specimen other than nasopharyngeal swab, presence of viral mutation(s) within the areas targeted by this assay, and inadequate number of viral copies (<131 copies/mL). A negative result must be combined with clinical observations, patient history, and epidemiological information. The expected result is Negative.  Fact Sheet for Patients:  https://www.moore.com/  Fact Sheet for Healthcare Providers:  https://www.young.biz/  This test is no t yet approved or cleared by the [place] FDA and  has been authorized for detection and/or diagnosis of SARS-CoV-2 by FDA under an Emergency Use Authorization (EUA). This EUA will remain  in effect (meaning this test can be used) for the duration of the COVID-19 declaration under  Section 564(b)(1) of the Act, 21 U.S.C. section 360bbb-3(b)(1), unless the authorization is terminated or revoked sooner.     Influenza A by PCR NEGATIVE NEGATIVE Final   Influenza B by PCR NEGATIVE NEGATIVE Final    Comment: (NOTE) The Xpert Xpress SARS-CoV-2/FLU/RSV assay is intended as an aid in  the diagnosis of influenza from Nasopharyngeal swab specimens and  should not be used as a sole basis for treatment. Nasal washings and  aspirates are unacceptable for Xpert Xpress SARS-CoV-2/FLU/RSV  testing.  Fact Sheet for Patients: https://www.moore.com/  Fact Sheet for Healthcare Providers: https://www.young.biz/  This test is not yet approved or cleared by the [place] FDA and  has been authorized for detection and/or diagnosis of SARS-CoV-2 by  FDA under an Emergency Use Authorization (EUA). This EUA will remain  in effect (meaning this test can be used) for the duration of the  Covid-19 declaration under Section 564(b)(1) of the Act, 21  U.S.C. section 360bbb-3(b)(1), unless the authorization is  terminated or revoked. Performed at [facility], [address] [place], [place] [address]   Urine culture     Status: Abnormal   Collection Time: 04/21/20 11:54 PM   Specimen: Urine, Random  Result Value Ref Range Status   Specimen Description   Final    URINE, RANDOM Performed at [facility], [address], [place], [place] [address]    Special Requests   Final    NONE Performed at [facility], [address] [place], [place] [address]    Culture (A)  Final    <  10,000 COLONIES/mL INSIGNIFICANT GROWTH Performed at [facility], [address] [place], [place] [address]    Report Status 04/23/2020 FINAL  Final         Radiology Studies: No results found.      Scheduled Meds: . acidophilus  1 capsule Oral BID  . vitamin C  1,000 mg Oral Daily  .  aspirin  81 mg Oral Daily  . baclofen  20 mg Oral TID  . bisacodyl  10 mg Rectal Once per day on Mon Wed Fri  . Chlorhexidine Gluconate Cloth  6 each Topical Daily  . docusate sodium  100 mg Oral BID  . heparin injection (subcutaneous)  5,000 Units Subcutaneous Q8H  . levothyroxine  12.5 mcg Oral Q0600  . morphine  15 mg Oral Q12H  . neomycin-bacitracin-polymyxin  1 application Topical Daily  . omega-3 acid ethyl esters  1,000 mg Oral Daily  . pantoprazole  40 mg Oral Q0600  . polyethylene glycol  17 g Oral BID  . senna  2 tablet Oral QHS  . simvastatin  40 mg Oral Daily   Continuous Infusions: . sodium chloride 10 mL/hr at 04/25/20 0200  .  ceFAZolin (ANCEF) IV 2 g (04/25/20 1419)     LOS: 4 days    Time spent:40 min    Nymir Ringler, Roselind Messier, MD [facility] Pager [phone]  If 7PM-7AM, please contact night-coverage [email or web address] Password [facility] 04/25/2020, 3:18 PM

## 2020-04-25 NOTE — Plan of Care (Deleted)
  Problem: Education: Goal: Knowledge of General Education information will improve Description: Including pain rating scale, medication(s)/side effects and non-pharmacologic comfort measures Outcome: Progressing   Problem: Activity: Goal: Risk for activity intolerance will decrease Outcome: Progressing   Problem: Pain Managment: Goal: General experience of comfort will improve Outcome: Progressing   

## 2020-04-25 NOTE — Care Management Important Message (Signed)
Important Message  Patient Details IM Letter given to the Patient Name: Deanna Morgan MRN: [id number] Date of Birth: 10/15/1950   Medicare Important Message Given:  Yes     Caren Macadam 04/25/2020, 11:42 AM

## 2020-04-26 ENCOUNTER — Inpatient Hospital Stay (HOSPITAL_COMMUNITY): Payer: Medicare Other

## 2020-04-26 LAB — CBC WITH DIFFERENTIAL/PLATELET
Abs Immature Granulocytes: 0.01 10*3/uL (ref 0.00–0.07)
Basophils Absolute: 0 10*3/uL (ref 0.0–0.1)
Basophils Relative: 0 %
Eosinophils Absolute: 0.1 10*3/uL (ref 0.0–0.5)
Eosinophils Relative: 3 %
HCT: 42.9 % (ref 36.0–46.0)
Hemoglobin: 13.5 g/dL (ref 12.0–15.0)
Immature Granulocytes: 0 %
Lymphocytes Relative: 19 %
Lymphs Abs: 0.9 10*3/uL (ref 0.7–4.0)
MCH: 28.8 pg (ref 26.0–34.0)
MCHC: 31.5 g/dL (ref 30.0–36.0)
MCV: 91.7 fL (ref 80.0–100.0)
Monocytes Absolute: 0.5 10*3/uL (ref 0.1–1.0)
Monocytes Relative: 11 %
Neutro Abs: 3.2 10*3/uL (ref 1.7–7.7)
Neutrophils Relative %: 67 %
Platelets: 174 10*3/uL (ref 150–400)
RBC: 4.68 MIL/uL (ref 3.87–5.11)
RDW: 13.2 % (ref 11.5–15.5)
WBC: 4.7 10*3/uL (ref 4.0–10.5)
nRBC: 0 % (ref 0.0–0.2)

## 2020-04-26 LAB — URINALYSIS, ROUTINE W REFLEX MICROSCOPIC
Bilirubin Urine: NEGATIVE
Glucose, UA: NEGATIVE mg/dL
Ketones, ur: NEGATIVE mg/dL
Nitrite: NEGATIVE
Protein, ur: 30 mg/dL — AB
RBC / HPF: >50 RBC/hpf — ABNORMAL HIGH (ref 0–5)
Specific Gravity, Urine: 1.013 (ref 1.005–1.030)
pH: 5 (ref 5.0–8.0)

## 2020-04-26 LAB — COMPREHENSIVE METABOLIC PANEL
ALT: 39 U/L (ref 0–44)
AST: 80 U/L — ABNORMAL HIGH (ref 15–41)
Albumin: 3.3 g/dL — ABNORMAL LOW (ref 3.5–5.0)
Alkaline Phosphatase: 62 U/L (ref 38–126)
Anion gap: 8 (ref 5–15)
BUN: 22 mg/dL (ref 8–23)
CO2: 28 mmol/L (ref 22–32)
Calcium: 8.7 mg/dL — ABNORMAL LOW (ref 8.9–10.3)
Chloride: 102 mmol/L (ref 98–111)
Creatinine, Ser: 0.67 mg/dL (ref 0.44–1.00)
GFR, Estimated: >60 mL/min (ref >60)
Glucose, Bld: 108 mg/dL — ABNORMAL HIGH (ref 70–99)
Potassium: 4.3 mmol/L (ref 3.5–5.1)
Sodium: 138 mmol/L (ref 135–145)
Total Bilirubin: 0.4 mg/dL (ref 0.3–1.2)
Total Protein: 6.4 g/dL — ABNORMAL LOW (ref 6.5–8.1)

## 2020-04-26 LAB — CULTURE, BLOOD (ROUTINE X 2)
Culture: NO GROWTH
Culture: NO GROWTH

## 2020-04-26 LAB — MAGNESIUM: Magnesium: 2.2 mg/dL (ref 1.7–2.4)

## 2020-04-26 LAB — PHOSPHORUS: Phosphorus: 4.3 mg/dL (ref 2.5–4.6)

## 2020-04-26 MED ORDER — OXYBUTYNIN CHLORIDE 5 MG PO TABS
2.5000 mg | ORAL_TABLET | Freq: Three times a day (TID) | ORAL | Status: DC
  Administered 2020-04-26 – 2020-04-29 (×9): 2.5 mg via ORAL
  Filled 2020-04-26 (×9): qty 1

## 2020-04-26 MED ORDER — LISINOPRIL 5 MG PO TABS
5.0000 mg | ORAL_TABLET | Freq: Every day | ORAL | Status: DC
  Administered 2020-04-27: 5 mg via ORAL
  Filled 2020-04-26 (×2): qty 1

## 2020-04-26 MED ORDER — BISACODYL 5 MG PO TBEC
5.0000 mg | DELAYED_RELEASE_TABLET | Freq: Two times a day (BID) | ORAL | Status: DC
  Administered 2020-04-26 – 2020-04-29 (×5): 5 mg via ORAL
  Filled 2020-04-26 (×5): qty 1

## 2020-04-26 MED ORDER — LACTULOSE 10 GM/15ML PO SOLN
30.0000 g | Freq: Two times a day (BID) | ORAL | Status: DC
  Administered 2020-04-26 – 2020-04-29 (×6): 30 g via ORAL
  Filled 2020-04-26 (×6): qty 45

## 2020-04-26 NOTE — Plan of Care (Signed)
  Problem: Pain Managment: Goal: General experience of comfort will improve 04/26/2020 0251 by Brett Albino, RN Outcome: Progressing 04/25/2020 1939 by Brett Albino, RN Outcome: Progressing 04/25/2020 1937 by Brett Albino, RN Outcome: Progressing

## 2020-04-26 NOTE — Progress Notes (Signed)
PROGRESS NOTE    Deanna Morgan  JDB:[id number] DOB: 1951-05-23 DOA: 04/21/2020 PCP: Florentina Jenny, MD     Brief Narrative:  69 y.o. 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  Was started on Keflex by patient's primary care physician about 3 days ago.  Patient states she has been rubbing her elbow on a chair which she states and noticed swelling around the left elbow and started getting red which started to spread proximally and distally.  Despite taking oral antibiotics patient swelling has got worse and presents to the ER.  ED Course: In the ER patient was afebrile on exam patient does have swelling around the olecranon bursa with redness of the arm and forearm on the left side.  ER physician had discussed with on-call orthopedic surgeon Dr. Victorino Dike who advised at this time IV antibiotics and Ace wrap and follow-up with orthopedics as outpatient in 2 weeks after antibiotics.  Given that patient has failed oral antibiotics and cellulitis is getting worse patient admitted for IV antibiotics.  Labs show hemoglobin of 11.9 Covid test negative.  Since patient has been having some bloating sensation in the abdomen x-ray of the abdomen was done which does not show anything acute.   Subjective: 10/19 afebrile overnight A/O x4, patient complaining of pain on urination.  Upset with urologist who performed what sounds like a cystoscopy a few weeks ago.  Patient does not know the name of the urologist.  However states that all the findings were WNL    Assessment & Plan: Covid vaccination; vaccinated   Principal Problem:   Cellulitis of left upper extremity Active Problems:   Quadriparesis  (HCC)   Syringomyelia (HCC)   HLD (hyperlipidemia)   Cellulitis   Chronic diastolic CHF (congestive heart failure) (HCC)   Essential hypertension   Normocytic anemia  LEFT arm Cellulitis with Olecranon bursitis -After speaking with patient who is very knowledgeable concerning her body.  Patient fairly certain she is never had MRSA or Pseudomonas, changed antibiotic to cefazolin. -7 to 14-day course cefazolin dependent upon how well patient responds given her debilitated state  Quadriparesis -Baclofen 20 mg TID -10/16 ordered air mattress -10/18 patient wants to start reducing her narcotic usage.  I counseled her at length that this will be a lengthy process given her length of use (greater than 30 years), however we could start here in the hospital but would need to be continued as an outpatient. -10/18 decrease MS Contin 15 mg 2200 -10/18 MSIR 7.5 mg @1000   Chronic pain syndrome -See quadriparesis  Chronic diastolic CHF -Unable to place patient on beta-blocker secondary to borderline bradycardia. -10/19 increase lisinopril 5 mg daily  -Strict ins and outs -3.9 L -Daily weight Filed Weights   04/21/20 1914 04/25/20 0500  Weight: 70.3 kg 74.9 kg    Essential HTN -See CHF  HLD -10/16 LDL = 90; goal LDL<70 -10/16 increase simvastatin 40 mg daily  Normocytic Anemia  -10/16 anemia panel consistent with normocytic anemia  Hypokalemia -Resolved  Abdominal bloating/Constipation -10/16 KUB pending; no obstruction moderate stool burden see below -10/16 smog enema -10/17 still some bloating but not as tight and patient much more comfortable. -10/18 repeat smog enema.   -10/19 KUB shows that repeat KUB  still did not fully cleanout patient's stool burden. -10/19 increase Lactulose 30 g BID -10/19 bisacodyl 5 mg BID  UTI? -Patient complaining of burning with urination. Obtain urinalysis and urine culture.  Bladder spasm -Patient over the last several days as mentioned what  sounded like she had a cystoscopy to 3 weeks ago, does not know physician's name.  Can only state that everything was WNL.  No notes in care everywhere or EPIC -10/19 Ditropan 2.5 mg TID   DVT prophylaxis: Subcu heparin Code Status: DNR Family Communication:  Status is: Inpatient    Dispo: The patient is from: SNF              Anticipated d/c is to: SNF              Anticipated d/c date is:??              Patient currently unstable      Consultants:    Procedures/Significant Events:  10/16 KUB; moderate colonic stool burden without evidence of obstruction 10/19 KUB: Moderate colonic stool and gas. No rectal impaction or obstructive pattern.   I have personally reviewed and interpreted all radiology studies and my findings are as above.  VENTILATOR SETTINGS:    Cultures   Antimicrobials: Anti-infectives (From admission, onward)   Start     Ordered Stop   04/22/20 1430  ceFAZolin (ANCEF) IVPB 2g/100 mL premix        04/22/20 1338     04/22/20 1100  vancomycin (VANCOCIN) IVPB 1000 mg/200 mL premix  Status:  Discontinued        04/21/20 2209 04/22/20 1328   04/22/20 0600  ceFEPIme (MAXIPIME) 2 g in sodium chloride 0.9 % 100 mL IVPB  Status:  Discontinued        04/21/20 2209 04/22/20 1328   04/21/20 2230  vancomycin (VANCOREADY) IVPB 1500 mg/300 mL        04/21/20 2158 04/22/20 0225   04/21/20 2200  ceFEPIme (MAXIPIME) 2 g in sodium chloride 0.9 % 100 mL IVPB        04/21/20 2158 04/21/20 2336   04/21/20 2100  cefTRIAXone (ROCEPHIN) 1 g in sodium chloride 0.9 % 100 mL IVPB        04/21/20 2049 04/21/20 2201       Devices    LINES / TUBES:      Continuous Infusions: . sodium chloride 10 mL/hr at 04/25/20 0200  .  ceFAZolin (ANCEF) IV 2 g (04/26/20 0709)     Objective: Vitals:   04/25/20 1323 04/25/20 2018 04/26/20 0538 04/26/20 0551  BP: (!) 145/76 (!) 158/104 (!) 167/96 125/88  Pulse: 66 78 77 77  Resp: Temp: 98 F (36.7 C) 97.8 F  (36.6 C) 98.8 F (37.1 C)   TempSrc: Oral Oral Oral   SpO2: 94% 95% 95%   Weight:      Height:        Intake/Output Summary (Last 24 hours) at 04/26/2020 0858 Last data filed at 04/26/2020 9604 Gross per 24 hour  Intake 1060 ml  Output 2421 ml  Net -1361 ml   Filed Weights   04/21/20 1914 04/25/20 0500  Weight: 70.3 kg 74.9 kg   Physical Exam:  General: A/O x4, No acute respiratory distress Eyes: negative scleral hemorrhage, negative anisocoria, negative icterus ENT: Negative Runny nose, negative gingival bleeding, Neck:  Negative scars, masses, torticollis, lymphadenopathy, JVD Lungs: Clear to auscultation bilaterally without wheezes or crackles Cardiovascular: Regular  rate and rhythm without murmur gallop or rub normal S1 and S2 Abdomen: negative abdominal pain, positive distention but less tender and tight, positive soft, bowel sounds, no rebound, no ascites, no appreciable mass Extremities: No significant cyanosis, clubbing, or edema bilateral lower extremities Skin: Negative rashes, lesions, ulcers Psychiatric:  Negative depression, negative anxiety, negative fatigue, negative mania  Central nervous system:  Cranial nerves II through XII intact, tongue/uvula midline, upper extremities muscle strength 3-4 /5 (with contraction and loss of fine motor skill), lower extremity muscle strength 0/5, sensation intact throughout, negative dysarthria, negative expressive aphasia, negative receptive aphasia. .     Data Reviewed: Care during the described time interval was provided by me .  I have reviewed this tight available data, including medical history, events of note, physical examination, and all test results as part of my evaluation.  CBC: Recent Labs  Lab 04/21/20 1903 04/21/20 1903 04/22/20 0500 04/23/20 0342 04/24/20 0334 04/25/20 0331 04/26/20 0327  WBC 6.2   < > 7.3 4.3 4.4 4.3 4.7  NEUTROABS 4.5  --   --  2.6 2.4 2.5 3.2  HGB 11.9*   < > 13.2 11.6* 10.8*  11.2* 13.5  HCT 37.7   < > 41.0 36.9 33.6* 35.3* 42.9  MCV 91.3   < > 91.9 90.9 91.6 92.4 91.7  PLT 205   < > 232 224 217 214 174   < > = values in this interval not displayed.   Basic Metabolic Panel: Recent Labs  Lab 04/22/20 0500 04/23/20 0342 04/24/20 0334 04/25/20 0331 04/26/20 0327  NA 137 138 140 140 138  K 3.7 3.4* 4.1 4.1 4.3  CL 101 102 104 104 102  CO2 23 26 26 27 28   GLUCOSE 118* 101* 114* 127* 108*  BUN 15 14 17 18 22   CREATININE 0.56 0.59 0.72 0.67 0.67  CALCIUM 9.0 8.8* 8.7* 8.6* 8.7*  MG  --  2.3 2.5* 2.4 2.2  PHOS  --  3.4 4.6 4.3 4.3   GFR: Estimated Creatinine Clearance: 63.4 mL/min (by C-G formula based on SCr of 0.67 mg/dL). Liver Function Tests: Recent Labs  Lab 04/22/20 0500 04/23/20 0342 04/24/20 0334 04/25/20 0331 04/26/20 0327  AST 17 17 13* 35 80*  ALT 24 20 11 17  39  ALKPHOS 70 56 52 61 62  BILITOT 0.9 0.5 0.7 0.5 0.4  PROT 7.8 6.8 6.1* 6.5 6.4*  ALBUMIN 4.0 3.4* 3.2* 3.2* 3.3*   No results for input(s): LIPASE, AMYLASE in the last 168 hours. No results for input(s): AMMONIA in the last 168 hours. Coagulation Profile: No results for input(s): INR, PROTIME in the last 168 hours. Cardiac Enzymes: No results for input(s): CKTOTAL, CKMB, CKMBINDEX, TROPONINI in the last 168 hours. BNP (last 3 results) No results for input(s): PROBNP in the last 8760 hours. HbA1C: No results for input(s): HGBA1C in the last 72 hours. CBG: No results for input(s): GLUCAP in the last 168 hours. Lipid Profile: No results for input(s): CHOL, HDL, LDLCALC, TRIG, CHOLHDL, LDLDIRECT in the last 72 hours. Thyroid Function Tests: No results for input(s): TSH, T4TOTAL, FREET4, T3FREE, THYROIDAB in the last 72 hours. Anemia Panel: Recent Labs    04/24/20 0334  VITAMINB12 332  FOLATE 16.4  FERRITIN 52  TIBC 304  IRON 60  RETICCTPCT 1.7   Sepsis Labs: Recent Labs  Lab 04/21/20 1903  LATICACIDVEN 1.0    Recent Results (from the past 240 hour(s))    Culture, blood (routine x 2)  Status: None   Collection Time: 04/21/20  7:02 PM   Specimen: BLOOD  Result Value Ref Range Status   Specimen Description   Final    BLOOD RIGHT Performed at [facility], [address], [place], [place] [address]    Special Requests   Final    BOTTLES DRAWN AEROBIC AND ANAEROBIC Blood Culture results may not be optimal due to an inadequate volume of blood received in culture bottles Performed at [facility], [address] [place], [place] [address]    Culture   Final    NO GROWTH 5 DAYS Performed at [facility], [address], [place], [place] [address]    Report Status 04/26/2020 FINAL  Final  Culture, blood (routine x 2)     Status: None   Collection Time: 04/21/20  7:07 PM   Specimen: BLOOD  Result Value Ref Range Status   Specimen Description   Final    BLOOD LEFT Performed at [facility], [address], [place], [place] [address]    Special Requests   Final    BOTTLES DRAWN AEROBIC AND ANAEROBIC Blood Culture results may not be optimal due to an inadequate volume of blood received in culture bottles Performed at [facility], [address], [place], [place] [address]    Culture   Final    NO GROWTH 5 DAYS Performed at [facility], [address] [place], [place] [address]    Report Status 04/26/2020 FINAL  Final  Respiratory Panel by RT PCR (Flu A&B, Covid) - Nasopharyngeal Swab     Status: None   Collection Time: 04/21/20 10:00 PM   Specimen: Nasopharyngeal Swab  Result Value Ref Range Status   SARS Coronavirus 2 by RT PCR NEGATIVE NEGATIVE Final    Comment: (NOTE) SARS-CoV-2 target nucleic acids are NOT DETECTED.  The SARS-CoV-2 RNA is generally detectable in upper respiratoy specimens during the acute phase of infection. The lowest concentration of SARS-CoV-2 viral copies this assay can detect is 131 copies/mL. A negative result  does not preclude SARS-Cov-2 infection and should not be used as the sole basis for treatment or other patient management decisions. A negative result may occur with  improper specimen collection/handling, submission of specimen other than nasopharyngeal swab, presence of viral mutation(s) within the areas targeted by this assay, and inadequate number of viral copies (<131 copies/mL). A negative result must be combined with clinical observations, patient history, and epidemiological information. The expected result is Negative.  Fact Sheet for Patients:  https://www.moore.com/  Fact Sheet for Healthcare Providers:  https://www.young.biz/  This test is no t yet approved or cleared by the [place] FDA and  has been authorized for detection and/or diagnosis of SARS-CoV-2 by FDA under an Emergency Use Authorization (EUA). This EUA will remain  in effect (meaning this test can be used) for the duration of the COVID-19 declaration under Section 564(b)(1) of the Act, 21 U.S.C. section 360bbb-3(b)(1), unless the authorization is terminated or revoked sooner.     Influenza A by PCR NEGATIVE NEGATIVE Final   Influenza B by PCR NEGATIVE NEGATIVE Final    Comment: (NOTE) The Xpert Xpress SARS-CoV-2/FLU/RSV assay is intended as an aid in  the diagnosis of influenza from Nasopharyngeal swab specimens and  should not be used as a sole basis for treatment. Nasal washings and  aspirates are unacceptable for Xpert Xpress SARS-CoV-2/FLU/RSV  testing.  Fact Sheet for Patients: https://www.moore.com/  Fact Sheet for Healthcare Providers: https://www.young.biz/  This test is not yet approved or cleared by the [place] FDA and  has been authorized for detection and/or diagnosis of SARS-CoV-2 by  FDA under an Emergency Use Authorization (EUA). This EUA will remain  in effect (meaning this test can be used) for  the duration of the  Covid-19 declaration under Section 564(b)(1) of the Act, 21  U.S.C. section 360bbb-3(b)(1), unless the authorization is  terminated or revoked. Performed at [facility], [address], [place], [place] [address]   Urine culture     Status: Abnormal   Collection Time: 04/21/20 11:54 PM   Specimen: Urine, Random  Result Value Ref Range Status   Specimen Description   Final    URINE, RANDOM Performed at [facility], [address] [place], [place] [address]    Special Requests   Final    NONE Performed at [facility], [address], [place], [place] [address]    Culture (A)  Final    <10,000 COLONIES/mL INSIGNIFICANT GROWTH Performed at [facility], [address] [place], [place] [address]    Report Status 04/23/2020 FINAL  Final         Radiology Studies: DG Abd 1 View  Result Date: 04/26/2020 CLINICAL DATA:  Constipation EXAM: ABDOMEN - 1 VIEW COMPARISON:  Three days ago FINDINGS: Gas is seen throughout most of the colon. Some stool is present at the ascending and transverse segments. No rectal impaction. No evidence of small bowel obstruction. No concerning mass effect or calcification. Spinal degeneration and dextroscoliosis IMPRESSION: Moderate colonic stool and gas. No rectal impaction or obstructive pattern. Electronically Signed   By: Marnee Spring M.D.   On: 04/26/2020 05:54        Scheduled Meds: . acidophilus  1 capsule Oral BID  . vitamin C  1,000 mg Oral Daily  . aspirin  81 mg Oral Daily  . baclofen  20 mg Oral TID  . bisacodyl  10 mg Rectal Once per day on Mon Wed Fri  . Chlorhexidine Gluconate Cloth  6 each Topical Daily  . docusate sodium  100 mg Oral BID  . heparin injection (subcutaneous)  5,000 Units Subcutaneous Q8H  . levothyroxine  12.5 mcg Oral Q0600  . lisinopril  2.5 mg Oral Daily  . morphine  15 mg Oral Daily  . morphine  7.5 mg Oral Daily  .  neomycin-bacitracin-polymyxin  1 application Topical Daily  . omega-3 acid ethyl esters  1,000 mg Oral Daily  . pantoprazole  40 mg Oral Q0600  . polyethylene glycol  17 g Oral BID  . senna  2 tablet Oral QHS  . simvastatin  40 mg Oral Daily   Continuous Infusions: . sodium chloride 10 mL/hr at 04/25/20 0200  .  ceFAZolin (ANCEF) IV 2 g (04/26/20 0709)     LOS: 5 days    Time spent:40 min    Karmel Patricelli, Roselind Messier, MD [facility] Pager [phone]  If 7PM-7AM, please contact night-coverage [email or web address] Password [email or web address] 04/26/2020, 8:58 AM

## 2020-04-27 LAB — COMPREHENSIVE METABOLIC PANEL
ALT: 41 U/L (ref 0–44)
AST: 75 U/L — ABNORMAL HIGH (ref 15–41)
Albumin: 3.3 g/dL — ABNORMAL LOW (ref 3.5–5.0)
Alkaline Phosphatase: 68 U/L (ref 38–126)
Anion gap: 11 (ref 5–15)
BUN: 20 mg/dL (ref 8–23)
CO2: 26 mmol/L (ref 22–32)
Calcium: 8.8 mg/dL — ABNORMAL LOW (ref 8.9–10.3)
Chloride: 102 mmol/L (ref 98–111)
Creatinine, Ser: 0.7 mg/dL (ref 0.44–1.00)
GFR, Estimated: >60 mL/min (ref >60)
Glucose, Bld: 146 mg/dL — ABNORMAL HIGH (ref 70–99)
Potassium: 4.1 mmol/L (ref 3.5–5.1)
Sodium: 139 mmol/L (ref 135–145)
Total Bilirubin: 0.4 mg/dL (ref 0.3–1.2)
Total Protein: 6.3 g/dL — ABNORMAL LOW (ref 6.5–8.1)

## 2020-04-27 LAB — URINE CULTURE: Culture: NO GROWTH

## 2020-04-27 LAB — CBC WITH DIFFERENTIAL/PLATELET
Abs Immature Granulocytes: 0.02 10*3/uL (ref 0.00–0.07)
Basophils Absolute: 0 10*3/uL (ref 0.0–0.1)
Basophils Relative: 0 %
Eosinophils Absolute: 0.1 10*3/uL (ref 0.0–0.5)
Eosinophils Relative: 3 %
HCT: 36.6 % (ref 36.0–46.0)
Hemoglobin: 11.4 g/dL — ABNORMAL LOW (ref 12.0–15.0)
Immature Granulocytes: 0 %
Lymphocytes Relative: 24 %
Lymphs Abs: 1.1 10*3/uL (ref 0.7–4.0)
MCH: 29.4 pg (ref 26.0–34.0)
MCHC: 31.1 g/dL (ref 30.0–36.0)
MCV: 94.3 fL (ref 80.0–100.0)
Monocytes Absolute: 0.4 10*3/uL (ref 0.1–1.0)
Monocytes Relative: 8 %
Neutro Abs: 3.1 10*3/uL (ref 1.7–7.7)
Neutrophils Relative %: 65 %
Platelets: 208 10*3/uL (ref 150–400)
RBC: 3.88 MIL/uL (ref 3.87–5.11)
RDW: 13.6 % (ref 11.5–15.5)
WBC: 4.8 10*3/uL (ref 4.0–10.5)
nRBC: 0 % (ref 0.0–0.2)

## 2020-04-27 LAB — PHOSPHORUS: Phosphorus: 4 mg/dL (ref 2.5–4.6)

## 2020-04-27 LAB — MAGNESIUM: Magnesium: 2.3 mg/dL (ref 1.7–2.4)

## 2020-04-27 MED ORDER — LISINOPRIL 10 MG PO TABS
10.0000 mg | ORAL_TABLET | Freq: Every day | ORAL | Status: DC
  Administered 2020-04-28 – 2020-04-29 (×2): 10 mg via ORAL
  Filled 2020-04-27 (×2): qty 1

## 2020-04-27 MED ORDER — FOSFOMYCIN TROMETHAMINE 3 G PO PACK
3.0000 g | PACK | Freq: Once | ORAL | Status: AC
  Administered 2020-04-27: 3 g via ORAL
  Filled 2020-04-27: qty 3

## 2020-04-27 MED ORDER — CEPHALEXIN 250 MG PO CAPS
250.0000 mg | ORAL_CAPSULE | Freq: Four times a day (QID) | ORAL | Status: DC
  Administered 2020-04-27 – 2020-04-29 (×9): 250 mg via ORAL
  Filled 2020-04-27 (×10): qty 1

## 2020-04-27 NOTE — Progress Notes (Signed)
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 Was started on Keflex by patient's primary care physician about 3 days ago. Patient states she has been rubbing her elbow on a chair which she states and noticed swelling around the left elbow and started getting red which started to spread proximally and distally. Despite taking oral antibiotics patient swelling has got worse and presented to the ER  Assessment & Plan:   Principal Problem:   Cellulitis of left upper extremity Active Problems:   Quadriparesis (HCC)   Syringomyelia (HCC)   HLD (hyperlipidemia)   Cellulitis   Chronic diastolic CHF (congestive heart failure) (HCC)   Essential hypertension   Normocytic anemia   LEFT arm Cellulitis with Olecranon bursitis -Case was initially discussed with Orthopedic Surgery -Was initially continued on IV ancef with notable improvement in redness, swelling -Plan for 10 day of tx -Discussed with ID pharmacist. As pt is responding well to ancef, will transition to PO keflex to complete tx -Repeat CBC in AM  Quadriparesis -Continued on Baclofen 20 mg TID -10/16 ordered air mattress -On 10/18 patient requested to start reducing her narcotic usage. As pt has been on chronic narcotics over past 66yrs, will need to very gradually decrease dosage over time -10/18 decrease MS Contin 15 mg 2200 -10/18 MSIR 7.5 mg @1000   Chronic pain syndrome -Continue analgesia as per above  Chronic diastolic CHF -Unable to place patient on beta-blocker secondary to HR in the  60's -Continue with lisinopril. BP suboptimally controlled. Will increase to 10mg   Essential HTN -Suboptimally controlled -Increase lisinopril from 5mg  to 10mg   HLD -10/16 LDL = 90; goal LDL<70 -Now on simvastatin 40 mg daily  Normocytic Anemia  -10/16 anemia panel consistent with normocytic anemia -Repeat CBC in AM  Hypokalemia -Resolved  Abdominal bloating/Constipation -Notable colonic stool seen on abd imaging -Good results noted with cathartics -Continue with aggressive bowel regimen as needed  UTI ruled in -Patient recently complained of burining with urination. -Urinalysis reviewed. Evidence of elevated leukocytes and WBC's -Urine cx thus far without growth -Discussed with ID pharmacist. Will give trial of fosfomycin x 1 dose  Bladder spasm -Patient over the last several days as mentioned what sounded like she had a cystoscopy to 3 weeks ago, -Continue with Ditropan 2.5 mg TID  DVT prophylaxis: Heparin subq Code Status: DNR Family Communication: Pt in room, family not at bedside  Status is: Inpatient  Remains inpatient appropriate because:Unsafe d/c plan, IV treatments appropriate due to intensity of illness or inability to take PO and Inpatient level of care appropriate due to severity of illness   Dispo: The patient is from: ALF              Anticipated d/c is to: SNF              Anticipated d/c date is: 2 days              Patient currently is not medically stable to d/c.       Consultants:   Orthopedic Surgery  Procedures:     Antimicrobials: Anti-infectives (From admission, onward)  Start     Dose/Rate Route Frequency Ordered Stop   04/27/20 1400  fosfomycin (MONUROL) packet 3 g        3 g Oral  Once 04/27/20 1149 04/27/20 1358   04/27/20 1400  cephALEXin (KEFLEX) capsule 250 mg        250 mg Oral Every 6 hours 04/27/20 1149     04/22/20 1430  ceFAZolin (ANCEF) IVPB 2g/100 mL premix  Status:  Discontinued        2 g 200 mL/hr  over 30 Minutes Intravenous Every 8 hours 04/22/20 1338 04/27/20 1149   04/22/20 1100  vancomycin (VANCOCIN) IVPB 1000 mg/200 mL premix  Status:  Discontinued        1,000 mg 200 mL/hr over 60 Minutes Intravenous Every 12 hours 04/21/20 2209 04/22/20 1328   04/22/20 0600  ceFEPIme (MAXIPIME) 2 g in sodium chloride 0.9 % 100 mL IVPB  Status:  Discontinued        2 g 200 mL/hr over 30 Minutes Intravenous Every 8 hours 04/21/20 2209 04/22/20 1328   04/21/20 2230  vancomycin (VANCOREADY) IVPB 1500 mg/300 mL        1,500 mg 150 mL/hr over 120 Minutes Intravenous  Once 04/21/20 2158 04/22/20 0225   04/21/20 2200  ceFEPIme (MAXIPIME) 2 g in sodium chloride 0.9 % 100 mL IVPB        2 g 200 mL/hr over 30 Minutes Intravenous  Once 04/21/20 2158 04/21/20 2336   04/21/20 2100  cefTRIAXone (ROCEPHIN) 1 g in sodium chloride 0.9 % 100 mL IVPB        1 g 200 mL/hr over 30 Minutes Intravenous  Once 04/21/20 2049 04/21/20 2201       Subjective: Without complaints this AM. Reports redness and swelling over L arm seems improved  Objective: Vitals:   04/27/20 0917 04/27/20 1000 04/27/20 1345 04/27/20 1601  BP: (!) 186/107  105/86 (!) 169/78  Pulse:  73 75 61  Resp:  18 16 16   Temp:  98 F (36.7 C) 97.6 F (36.4 C) 98 F (36.7 C)  TempSrc:  Oral Oral Oral  SpO2:  98% 95% 100%  Weight:      Height:        Intake/Output Summary (Last 24 hours) at 04/27/2020 1721 Last data filed at 04/27/2020 1400 Gross per 24 hour  Intake 1463.49 ml  Output 1150 ml  Net 313.49 ml   Filed Weights   04/21/20 1914 04/25/20 0500  Weight: 70.3 kg 74.9 kg    Examination: General exam: Appears calm and comfortable  Respiratory system: Clear to auscultation. Respiratory effort normal. Cardiovascular system: S1 & S2 heard, Regular Gastrointestinal system: Abdomen is nondistended, soft and nontender. No organomegaly or masses felt. Normal bowel sounds heard. Central nervous system: Alert and oriented. No focal  neurological deficits. Extremities: Symmetric 5 x 5 power. Skin: No rashes, lesions, minimal erythema over R arm Psychiatry: Judgement and insight appear normal. Mood & affect appropriate.   Data Reviewed: I have personally reviewed following labs and imaging studies  CBC: Recent Labs  Lab 04/23/20 0342 04/24/20 0334 04/25/20 0331 04/26/20 0327 04/27/20 0351  WBC 4.3 4.4 4.3 4.7 4.8  NEUTROABS 2.6 2.4 2.5 3.2 3.1  HGB 11.6* 10.8* 11.2* 13.5 11.4*  HCT 36.9 33.6* 35.3* 42.9 36.6  MCV 90.9 91.6 92.4 91.7 94.3  PLT 224 217 214 174 208   Basic Metabolic Panel: Recent Labs  Lab 04/23/20 0342 04/24/20 0334 04/25/20 0331 04/26/20 0327 04/27/20 0351  NA 138 140 140 138 139  K 3.4* 4.1 4.1 4.3 4.1  CL 102 104 104 102 102  CO2 GLUCOSE 101* 114* 127* 108* 146*  BUN CREATININE 0.59 0.72 0.67 0.67 0.70  CALCIUM 8.8* 8.7* 8.6* 8.7* 8.8*  MG 2.3 2.5* 2.4 2.2 2.3  PHOS 3.4 4.6 4.3 4.3 4.0   GFR: Estimated Creatinine Clearance: 63.4 mL/min (by C-G formula based on SCr of 0.7 mg/dL). Liver Function Tests: Recent Labs  Lab 04/23/20 0342 04/24/20 0334 04/25/20 0331 04/26/20 0327 04/27/20 0351  AST 17 13* 35 80* 75*  ALT 39 41  ALKPHOS 56 52 61 62 68  BILITOT 0.5 0.7 0.5 0.4 0.4  PROT 6.8 6.1* 6.5 6.4* 6.3*  ALBUMIN 3.4* 3.2* 3.2* 3.3* 3.3*   No results for input(s): LIPASE, AMYLASE in the last 168 hours. No results for input(s): AMMONIA in the last 168 hours. Coagulation Profile: No results for input(s): INR, PROTIME in the last 168 hours. Cardiac Enzymes: No results for input(s): CKTOTAL, CKMB, CKMBINDEX, TROPONINI in the last 168 hours. BNP (last 3 results) No results for input(s): PROBNP in the last 8760 hours. HbA1C: No results for input(s): HGBA1C in the last 72 hours. CBG: No results for input(s): GLUCAP in the last 168 hours. Lipid Profile: No results for input(s): CHOL, HDL, LDLCALC, TRIG, CHOLHDL, LDLDIRECT in the  last 72 hours. Thyroid Function Tests: No results for input(s): TSH, T4TOTAL, FREET4, T3FREE, THYROIDAB in the last 72 hours. Anemia Panel: No results for input(s): VITAMINB12, FOLATE, FERRITIN, TIBC, IRON, RETICCTPCT in the last 72 hours. Sepsis Labs: Recent Labs  Lab 04/21/20 1903  LATICACIDVEN 1.0    Recent Results (from the past 240 hour(s))  Culture, blood (routine x 2)     Status: None   Collection Time: 04/21/20  7:02 PM   Specimen: BLOOD  Result Value Ref Range Status   Specimen Description   Final    BLOOD RIGHT Performed at [facility], [address], [place], [place] [address]    Special Requests   Final    BOTTLES DRAWN AEROBIC AND ANAEROBIC Blood Culture results may not be optimal due to an inadequate volume of blood received in culture bottles Performed at [facility], [address] [place], [place] [address]    Culture   Final    NO GROWTH 5 DAYS Performed at [facility], [address], [place], [place] [address]    Report Status 04/26/2020 FINAL  Final  Culture, blood (routine x 2)     Status: None   Collection Time: 04/21/20  7:07 PM   Specimen: BLOOD  Result Value Ref Range Status   Specimen Description   Final    BLOOD LEFT Performed at [facility], [address] [place], [place] [address]    Special Requests   Final    BOTTLES DRAWN AEROBIC AND ANAEROBIC Blood Culture results may not be optimal due to an inadequate volume of blood received in culture bottles Performed at [facility], [address], [place], [place] [address]    Culture   Final    NO GROWTH 5 DAYS Performed at [facility], [address], [place], [place] [address]    Report Status 04/26/2020 FINAL  Final  Respiratory Panel by RT PCR (Flu A&B, Covid) - Nasopharyngeal Swab     Status: None  Collection Time: 04/21/20 10:00 PM   Specimen: Nasopharyngeal Swab  Result Value Ref Range  Status   SARS Coronavirus 2 by RT PCR NEGATIVE NEGATIVE Final    Comment: (NOTE) SARS-CoV-2 target nucleic acids are NOT DETECTED.  The SARS-CoV-2 RNA is generally detectable in upper respiratoy specimens during the acute phase of infection. The lowest concentration of SARS-CoV-2 viral copies this assay can detect is 131 copies/mL. A negative result does not preclude SARS-Cov-2 infection and should not be used as the sole basis for treatment or other patient management decisions. A negative result may occur with  improper specimen collection/handling, submission of specimen other than nasopharyngeal swab, presence of viral mutation(s) within the areas targeted by this assay, and inadequate number of viral copies (<131 copies/mL). A negative result must be combined with clinical observations, patient history, and epidemiological information. The expected result is Negative.  Fact Sheet for Patients:  https://www.moore.com/  Fact Sheet for Healthcare Providers:  https://www.young.biz/  This test is no t yet approved or cleared by the [place] FDA and  has been authorized for detection and/or diagnosis of SARS-CoV-2 by FDA under an Emergency Use Authorization (EUA). This EUA will remain  in effect (meaning this test can be used) for the duration of the COVID-19 declaration under Section 564(b)(1) of the Act, 21 U.S.C. section 360bbb-3(b)(1), unless the authorization is terminated or revoked sooner.     Influenza A by PCR NEGATIVE NEGATIVE Final   Influenza B by PCR NEGATIVE NEGATIVE Final    Comment: (NOTE) The Xpert Xpress SARS-CoV-2/FLU/RSV assay is intended as an aid in  the diagnosis of influenza from Nasopharyngeal swab specimens and  should not be used as a sole basis for treatment. Nasal washings and  aspirates are unacceptable for Xpert Xpress SARS-CoV-2/FLU/RSV  testing.  Fact Sheet for  Patients: https://www.moore.com/  Fact Sheet for Healthcare Providers: https://www.young.biz/  This test is not yet approved or cleared by the [place] FDA and  has been authorized for detection and/or diagnosis of SARS-CoV-2 by  FDA under an Emergency Use Authorization (EUA). This EUA will remain  in effect (meaning this test can be used) for the duration of the  Covid-19 declaration under Section 564(b)(1) of the Act, 21  U.S.C. section 360bbb-3(b)(1), unless the authorization is  terminated or revoked. Performed at [facility], [address], [place], [place] [address]   Urine culture     Status: Abnormal   Collection Time: 04/21/20 11:54 PM   Specimen: Urine, Random  Result Value Ref Range Status   Specimen Description   Final    URINE, RANDOM Performed at [facility], [address], [place], [place] [address]    Special Requests   Final    NONE Performed at [facility], [address], [place], [place] [address]    Culture (A)  Final    <10,000 COLONIES/mL INSIGNIFICANT GROWTH Performed at [facility], [address], [place], [place] [address]    Report Status 04/23/2020 FINAL  Final  Culture, Urine     Status: None   Collection Time: 04/26/20 12:00 PM   Specimen: Urine, Clean Catch  Result Value Ref Range Status   Specimen Description   Final    URINE, CLEAN CATCH Performed at [facility], [address] [place], [place] [address]    Special Requests   Final    NONE Performed at [facility], [address] [place], [place]  [address]    Culture   Final    NO GROWTH Performed at [facility], [address] [place], [place] [address]    Report Status 04/27/2020 FINAL  Final     Radiology Studies: DG Abd 1 View  Result Date: 04/26/2020 CLINICAL DATA:  Constipation EXAM: ABDOMEN - 1 VIEW COMPARISON:   Three days ago FINDINGS: Gas is seen throughout most of the colon. Some stool is present at the ascending and transverse segments. No rectal impaction. No evidence of small bowel obstruction. No concerning mass effect or calcification. Spinal degeneration and dextroscoliosis IMPRESSION: Moderate colonic stool and gas. No rectal impaction or obstructive pattern. Electronically Signed   By: Marnee Spring M.D.   On: 04/26/2020 05:54    Scheduled Meds: . acidophilus  1 capsule Oral BID  . vitamin C  1,000 mg Oral Daily  . aspirin  81 mg Oral Daily  . baclofen  20 mg Oral TID  . bisacodyl  5 mg Oral BID  . bisacodyl  10 mg Rectal Once per day on Mon Wed Fri  . cephALEXin  250 mg Oral Q6H  . Chlorhexidine Gluconate Cloth  6 each Topical Daily  . docusate sodium  100 mg Oral BID  . heparin injection (subcutaneous)  5,000 Units Subcutaneous Q8H  . lactulose  30 g Oral BID  . levothyroxine  12.5 mcg Oral Q0600  . lisinopril  5 mg Oral Daily  . morphine  15 mg Oral Daily  . morphine  7.5 mg Oral Daily  . neomycin-bacitracin-polymyxin  1 application Topical Daily  . omega-3 acid ethyl esters  1,000 mg Oral Daily  . oxybutynin  2.5 mg Oral TID  . pantoprazole  40 mg Oral Q0600  . polyethylene glycol  17 g Oral BID  . senna  2 tablet Oral QHS  . simvastatin  40 mg Oral Daily   Continuous Infusions: . sodium chloride 10 mL/hr at 04/25/20 0200     LOS: 6 days   Rickey Barbara, MD [facility] Pager On Amion  If 7PM-7AM, please contact night-coverage 04/27/2020, 5:21 PM

## 2020-04-28 DIAGNOSIS — I10 Essential (primary) hypertension: Secondary | ICD-10-CM

## 2020-04-28 DIAGNOSIS — I5032 Chronic diastolic (congestive) heart failure: Secondary | ICD-10-CM

## 2020-04-28 LAB — COMPREHENSIVE METABOLIC PANEL
ALT: 53 U/L — ABNORMAL HIGH (ref 0–44)
AST: 100 U/L — ABNORMAL HIGH (ref 15–41)
Albumin: 3.8 g/dL (ref 3.5–5.0)
Alkaline Phosphatase: 75 U/L (ref 38–126)
Anion gap: 9 (ref 5–15)
BUN: 24 mg/dL — ABNORMAL HIGH (ref 8–23)
CO2: 28 mmol/L (ref 22–32)
Calcium: 9.1 mg/dL (ref 8.9–10.3)
Chloride: 102 mmol/L (ref 98–111)
Creatinine, Ser: 0.66 mg/dL (ref 0.44–1.00)
GFR, Estimated: >60 mL/min (ref >60)
Glucose, Bld: 116 mg/dL — ABNORMAL HIGH (ref 70–99)
Potassium: 4.9 mmol/L (ref 3.5–5.1)
Sodium: 139 mmol/L (ref 135–145)
Total Bilirubin: 0.7 mg/dL (ref 0.3–1.2)
Total Protein: 7.4 g/dL (ref 6.5–8.1)

## 2020-04-28 LAB — CBC
HCT: 38.7 % (ref 36.0–46.0)
Hemoglobin: 12.1 g/dL (ref 12.0–15.0)
MCH: 29.3 pg (ref 26.0–34.0)
MCHC: 31.3 g/dL (ref 30.0–36.0)
MCV: 93.7 fL (ref 80.0–100.0)
Platelets: 247 10*3/uL (ref 150–400)
RBC: 4.13 MIL/uL (ref 3.87–5.11)
RDW: 13.6 % (ref 11.5–15.5)
WBC: 5.8 10*3/uL (ref 4.0–10.5)
nRBC: 0 % (ref 0.0–0.2)

## 2020-04-28 LAB — RESPIRATORY PANEL BY RT PCR (FLU A&B, COVID)
Influenza A by PCR: NEGATIVE
Influenza B by PCR: NEGATIVE
SARS Coronavirus 2 by RT PCR: NEGATIVE

## 2020-04-28 LAB — MAGNESIUM: Magnesium: 2.6 mg/dL — ABNORMAL HIGH (ref 1.7–2.4)

## 2020-04-28 LAB — PHOSPHORUS: Phosphorus: 3.9 mg/dL (ref 2.5–4.6)

## 2020-04-28 NOTE — Plan of Care (Signed)
  Problem: Education: Goal: Knowledge of General Education information will improve Description: Including pain rating scale, medication(s)/side effects and non-pharmacologic comfort measures Outcome: Progressing   Problem: Health Behavior/Discharge Planning: Goal: Ability to manage health-related needs will improve Outcome: Progressing   Problem: Clinical Measurements: Goal: Ability to maintain clinical measurements within normal limits will improve Outcome: Progressing Goal: Will remain free from infection Outcome: Progressing Goal: Diagnostic test results will improve Outcome: Progressing Goal: Respiratory complications will improve Outcome: Progressing Goal: Cardiovascular complication will be avoided Outcome: Progressing   Problem: Activity: Goal: Risk for activity intolerance will decrease Outcome: Progressing   Problem: Coping: Goal: Level of anxiety will decrease Outcome: Progressing   Problem: Elimination: Goal: Will not experience complications related to bowel motility Outcome: Progressing Goal: Will not experience complications related to urinary retention Outcome: Progressing   Problem: Pain Managment: Goal: General experience of comfort will improve Outcome: Progressing   

## 2020-04-28 NOTE — TOC Progression Note (Addendum)
Transition of Care [facility]) - Progression Note    Patient Details  Name: Deanna Morgan MRN: [id number] Date of Birth: 1950/12/24  Transition [facility] CM/SW Contact  Amada Jupiter, LCSW Phone Number: 04/28/2020, 2:17 PM  Clinical Narrative:    MD confirming today that pt has been changed to PO abx coverage.  Have alerted [facility]F of po status and await confirmation from that facility that pt is cleared for return.  Awaiting COVID test results as well.    Addendum:  Joselyn Arrow has cleared pt for return to facility with plan to dc tomorrow.   Expected Discharge Plan: Assisted Living (return to ALF vs short term SNF (if IV abx needed)) Barriers to Discharge: Continued Medical Work up  Expected Discharge Plan and Services Expected Discharge Plan: Assisted Living (return to ALF vs short term SNF (if IV abx needed))       Living arrangements for the past 2 months: Assisted Living Facility                                       Social Determinants of Health (SDOH) Interventions    Readmission Risk Interventions Readmission Risk Prevention Plan 04/22/2020  Post Dischage Appt Complete  Medication Screening Complete  Transportation Screening Complete  Some recent data might be hidden

## 2020-04-28 NOTE — Progress Notes (Signed)
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 Was started on Keflex by patient's primary care physician about 3 days ago. Patient states she has been rubbing her elbow on a chair which she states and noticed swelling around the left elbow and started getting red which started to spread proximally and distally. Despite taking oral antibiotics patient swelling has got worse and presented to the ER  Assessment & Plan:   Principal Problem:   Cellulitis of left upper extremity Active Problems:   Quadriparesis (HCC)   Syringomyelia (HCC)   HLD (hyperlipidemia)   Cellulitis   Chronic diastolic CHF (congestive heart failure) (HCC)   Essential hypertension   Normocytic anemia  LEFT arm Cellulitis with Olecranon bursitis -Case was initially discussed with Orthopedic Surgery -Was initially continued on IV ancef with notable improvement in redness, swelling -Plan for 10 day of tx -Discussed with ID pharmacist. As pt is responding well to ancef, will transition to PO keflex to complete tx. Discussed with Orthopedic Surgery today who agrees with transition to PO abx -Recheck CBC in AM  Quadriparesis -Continued on Baclofen 20 mg TID -10/16 ordered air mattress -On 10/18 patient requested to start reducing her narcotic usage. As pt has been on chronic narcotics over past 24yrs, will need to very gradually decrease dosage over time -10/18 decrease MS Contin 15 mg 2200 -10/18 MSIR 7.5 mg   Chronic pain syndrome -Continue analgesia as noted above  Chronic  diastolic CHF -Unable to place patient on beta-blocker secondary to HR in the 60's -Continue with lisinopril per above  Essential HTN -Now well controlled after increasing lisinopril from  to   HLD -10/16 LDL = 90; goal LDL<70 -Continue on simvastatin 40 mg daily  Normocytic Anemia  -10/16 anemia panel consistent with normocytic anemia -Repeat CBC in AM  Hypokalemia -Resolved  Abdominal bloating/Constipation -Notable colonic stool seen on abd imaging -Good results noted with cathartics -Continue with bowel regimen as needed  UTI ruled in -Patient recently complained of burining with urination. -Urinalysis reviewed. Evidence of elevated leukocytes and WBC's -Urine cx thus far without growth -Pt was given fosfomycin x 1 dose on 10/20  Bladder spasm -Patient over the last several days as mentioned what sounded like she had a cystoscopy to 3 weeks ago, -Continue with Ditropan 2.5 mg TID  DVT prophylaxis: Heparin subq Code Status: DNR Family Communication: Pt in room, family not at bedside  Status is: Inpatient  Remains inpatient appropriate because:Unsafe d/c plan, IV treatments appropriate due to intensity of illness or inability to take PO and Inpatient level of care appropriate due to severity of illness   Dispo: The patient is from: ALF              Anticipated d/c is to: ALF              Anticipated d/c date is: 1 day              Patient currently is not medically stable to d/c.       Consultants:   Orthopedic Surgery  Procedures:     Antimicrobials: Anti-infectives (From  admission, onward)   Start     Dose/Rate Route Frequency Ordered Stop   04/27/20 1400  fosfomycin (MONUROL) packet 3 g        3 g Oral  Once 04/27/20 1149 04/27/20 1358   04/27/20 1400  cephALEXin (KEFLEX) capsule 250 mg        250 mg Oral Every 6 hours 04/27/20 1149     04/22/20 1430  ceFAZolin (ANCEF) IVPB 2g/100 mL premix  Status:  Discontinued        2 g 200  mL/hr over 30 Minutes Intravenous Every 8 hours 04/22/20 1338 04/27/20 1149   04/22/20 1100  vancomycin (VANCOCIN) IVPB 1000 mg/200 mL premix  Status:  Discontinued        1,000 mg 200 mL/hr over 60 Minutes Intravenous Every 12 hours 04/21/20 2209 04/22/20 1328   04/22/20 0600  ceFEPIme (MAXIPIME) 2 g in sodium chloride 0.9 % 100 mL IVPB  Status:  Discontinued        2 g 200 mL/hr over 30 Minutes Intravenous Every 8 hours 04/21/20 2209 04/22/20 1328   04/21/20 2230  vancomycin (VANCOREADY) IVPB 1500 mg/300 mL        1,500 mg 150 mL/hr over 120 Minutes Intravenous  Once 04/21/20 2158 04/22/20 0225   04/21/20 2200  ceFEPIme (MAXIPIME) 2 g in sodium chloride 0.9 % 100 mL IVPB        2 g 200 mL/hr over 30 Minutes Intravenous  Once 04/21/20 2158 04/21/20 2336   04/21/20 2100  cefTRIAXone (ROCEPHIN) 1 g in sodium chloride 0.9 % 100 mL IVPB        1 g 200 mL/hr over 30 Minutes Intravenous  Once 04/21/20 2049 04/21/20 2201      Subjective: No complaints this AM. Still having bowel movements  Objective: Vitals:   04/27/20 1601 04/27/20 2125 04/28/20 0515 04/28/20 1236  BP: (!) 169/78 (!) 143/69 (!) 105/55 123/64  Pulse: 61 75 75 71  Resp: 16 18 18 17   Temp: 98 F (36.7 C) 98 F (36.7 C) 98.3 F (36.8 C) 98.5 F (36.9 C)  TempSrc: Oral Oral Oral Oral  SpO2: 100% 95% (!) 89% 96%  Weight:      Height:        Intake/Output Summary (Last 24 hours) at 04/28/2020 1730 Last data filed at 04/28/2020 0900 Gross per 24 hour  Intake 960 ml  Output 975 ml  Net -15 ml   Filed Weights   04/21/20 1914 04/25/20 0500  Weight: 70.3 kg 74.9 kg    Examination: General exam: Awake, laying in bed, in nad Respiratory system: Normal respiratory effort, no wheezing Cardiovascular system: regular rate, s1, s2 Gastrointestinal system: Soft, nondistended, positive BS Central nervous system: CN2-12 grossly intact, strength intact Extremities: Perfused, no clubbing Skin: Normal skin turgor, no  notable skin lesions seen Psychiatry: Mood normal // no visual hallucinations   Data Reviewed: I have personally reviewed following labs and imaging studies  CBC: Recent Labs  Lab 04/23/20 0342 04/23/20 0342 04/24/20 0334 04/25/20 0331 04/26/20 0327 04/27/20 0351 04/28/20 0307  WBC 4.3   < > 4.4 4.3 4.7 4.8 5.8  NEUTROABS 2.6  --  2.4 2.5 3.2 3.1  --   HGB 11.6*   < > 10.8* 11.2* 13.5 11.4* 12.1  HCT 36.9   < > 33.6* 35.3* 42.9 36.6 38.7  MCV 90.9   < > 91.6 92.4 91.7 94.3 93.7  PLT 224   < > 217 214 174 208 247   < > =  values in this interval not displayed.   Basic Metabolic Panel: Recent Labs  Lab 04/24/20 0334 04/25/20 0331 04/26/20 0327 04/27/20 0351 04/28/20 0307  NA 140 140 138 139 139  K 4.1 4.1 4.3 4.1 4.9  CL 104 104 102 102 102  CO2 26 27 28 26 28   GLUCOSE 114* 127* 108* 146* 116*  BUN 17 18 22 20  24*  CREATININE 0.72 0.67 0.67 0.70 0.66  CALCIUM 8.7* 8.6* 8.7* 8.8* 9.1  MG 2.5* 2.4 2.2 2.3 2.6*  PHOS 4.6 4.3 4.3 4.0 3.9   GFR: Estimated Creatinine Clearance: 63.4 mL/min (by C-G formula based on SCr of 0.66 mg/dL). Liver Function Tests: Recent Labs  Lab 04/24/20 0334 04/25/20 0331 04/26/20 0327 04/27/20 0351 04/28/20 0307  AST 13* 35 80* 75* 100*  ALT 11 17 39 41 53*  ALKPHOS 52 61 62 68 75  BILITOT 0.7 0.5 0.4 0.4 0.7  PROT 6.1* 6.5 6.4* 6.3* 7.4  ALBUMIN 3.2* 3.2* 3.3* 3.3* 3.8   No results for input(s): LIPASE, AMYLASE in the last 168 hours. No results for input(s): AMMONIA in the last 168 hours. Coagulation Profile: No results for input(s): INR, PROTIME in the last 168 hours. Cardiac Enzymes: No results for input(s): CKTOTAL, CKMB, CKMBINDEX, TROPONINI in the last 168 hours. BNP (last 3 results) No results for input(s): PROBNP in the last 8760 hours. HbA1C: No results for input(s): HGBA1C in the last 72 hours. CBG: No results for input(s): GLUCAP in the last 168 hours. Lipid Profile: No results for input(s): CHOL, HDL, LDLCALC,  TRIG, CHOLHDL, LDLDIRECT in the last 72 hours. Thyroid Function Tests: No results for input(s): TSH, T4TOTAL, FREET4, T3FREE, THYROIDAB in the last 72 hours. Anemia Panel: No results for input(s): VITAMINB12, FOLATE, FERRITIN, TIBC, IRON, RETICCTPCT in the last 72 hours. Sepsis Labs: Recent Labs  Lab 04/21/20 1903  LATICACIDVEN 1.0    Recent Results (from the past 240 hour(s))  Culture, blood (routine x 2)     Status: None   Collection Time: 04/21/20  7:02 PM   Specimen: BLOOD  Result Value Ref Range Status   Specimen Description   Final    BLOOD RIGHT Performed at [facility], [address], [place], [place]    Special Requests   Final    BOTTLES DRAWN AEROBIC AND ANAEROBIC Blood Culture results may not be optimal due to an inadequate volume of blood received in culture bottles Performed at [facility], [address] [place], [place]    Culture   Final    NO GROWTH 5 DAYS Performed at [facility], [address] [place], [address] [place]    Report Status 04/26/2020 FINAL  Final  Culture, blood (routine x 2)     Status: None   Collection Time: 04/21/20  7:07 PM   Specimen: BLOOD  Result Value Ref Range Status   Specimen Description   Final    BLOOD LEFT Performed at [facility], [address], [place], [place]    Special Requests   Final    BOTTLES DRAWN AEROBIC AND ANAEROBIC Blood Culture results may not be optimal due to an inadequate volume of blood received in culture bottles Performed at [facility], [address], [place], [place]    Culture   Final    NO GROWTH 5 DAYS Performed at [facility], [address] [place], [address] [place]  Report Status 04/26/2020 FINAL  Final  Respiratory Panel by RT PCR (Flu A&B, Covid) - Nasopharyngeal Swab     Status: None   Collection Time: 04/21/20 10:00 PM   Specimen: Nasopharyngeal  Swab  Result Value Ref Range Status   SARS Coronavirus 2 by RT PCR NEGATIVE NEGATIVE Final    Comment: (NOTE) SARS-CoV-2 target nucleic acids are NOT DETECTED.  The SARS-CoV-2 RNA is generally detectable in upper respiratoy specimens during the acute phase of infection. The lowest concentration of SARS-CoV-2 viral copies this assay can detect is 131 copies/mL. A negative result does not preclude SARS-Cov-2 infection and should not be used as the sole basis for treatment or other patient management decisions. A negative result may occur with  improper specimen collection/handling, submission of specimen other than nasopharyngeal swab, presence of viral mutation(s) within the areas targeted by this assay, and inadequate number of viral copies (<131 copies/mL). A negative result must be combined with clinical observations, patient history, and epidemiological information. The expected result is Negative.  Fact Sheet for Patients:  https://www.moore.com/  Fact Sheet for Healthcare Providers:  https://www.young.biz/  This test is no t yet approved or cleared by the [place] FDA and  has been authorized for detection and/or diagnosis of SARS-CoV-2 by FDA under an Emergency Use Authorization (EUA). This EUA will remain  in effect (meaning this test can be used) for the duration of the COVID-19 declaration under Section 564(b)(1) of the Act, 21 U.S.C. section 360bbb-3(b)(1), unless the authorization is terminated or revoked sooner.     Influenza A by PCR NEGATIVE NEGATIVE Final   Influenza B by PCR NEGATIVE NEGATIVE Final    Comment: (NOTE) The Xpert Xpress SARS-CoV-2/FLU/RSV assay is intended as an aid in  the diagnosis of influenza from Nasopharyngeal swab specimens and  should not be used as a sole basis for treatment. Nasal washings and  aspirates are unacceptable for Xpert Xpress SARS-CoV-2/FLU/RSV  testing.  Fact Sheet for  Patients: https://www.moore.com/  Fact Sheet for Healthcare Providers: https://www.young.biz/  This test is not yet approved or cleared by the [place] FDA and  has been authorized for detection and/or diagnosis of SARS-CoV-2 by  FDA under an Emergency Use Authorization (EUA). This EUA will remain  in effect (meaning this test can be used) for the duration of the  Covid-19 declaration under Section 564(b)(1) of the Act, 21  U.S.C. section 360bbb-3(b)(1), unless the authorization is  terminated or revoked. Performed at [facility], [address], [place], [place] [address]   Urine culture     Status: Abnormal   Collection Time: 04/21/20 11:54 PM   Specimen: Urine, Random  Result Value Ref Range Status   Specimen Description   Final    URINE, RANDOM Performed at [facility], [address], [place], [place] [address]    Special Requests   Final    NONE Performed at [facility], [address], [place], [place] [address]    Culture (A)  Final    <10,000 COLONIES/mL INSIGNIFICANT GROWTH Performed at [facility], [address], [place], [place] [address]    Report Status 04/23/2020 FINAL  Final  Culture, Urine     Status: None   Collection Time: 04/26/20 12:00 PM   Specimen: Urine, Clean Catch  Result Value Ref Range Status   Specimen Description   Final    URINE, CLEAN CATCH Performed at [facility], [address] [place],  [place] [address]    Special Requests   Final    NONE Performed at [facility], [address], [place], [place] [address]    Culture   Final    NO GROWTH Performed at [facility], [address], [place], [place] [address]    Report Status 04/27/2020 FINAL  Final  Respiratory Panel by RT PCR (Flu A&B, Covid) - Nasopharyngeal Swab     Status: None   Collection Time: 04/28/20  1:50 PM    Specimen: Nasopharyngeal Swab  Result Value Ref Range Status   SARS Coronavirus 2 by RT PCR NEGATIVE NEGATIVE Final    Comment: (NOTE) SARS-CoV-2 target nucleic acids are NOT DETECTED.  The SARS-CoV-2 RNA is generally detectable in upper respiratoy specimens during the acute phase of infection. The lowest concentration of SARS-CoV-2 viral copies this assay can detect is 131 copies/mL. A negative result does not preclude SARS-Cov-2 infection and should not be used as the sole basis for treatment or other patient management decisions. A negative result may occur with  improper specimen collection/handling, submission of specimen other than nasopharyngeal swab, presence of viral mutation(s) within the areas targeted by this assay, and inadequate number of viral copies (<131 copies/mL). A negative result must be combined with clinical observations, patient history, and epidemiological information. The expected result is Negative.  Fact Sheet for Patients:  https://www.moore.com/  Fact Sheet for Healthcare Providers:  https://www.young.biz/  This test is no t yet approved or cleared by the [place] FDA and  has been authorized for detection and/or diagnosis of SARS-CoV-2 by FDA under an Emergency Use Authorization (EUA). This EUA will remain  in effect (meaning this test can be used) for the duration of the COVID-19 declaration under Section 564(b)(1) of the Act, 21 U.S.C. section 360bbb-3(b)(1), unless the authorization is terminated or revoked sooner.     Influenza A by PCR NEGATIVE NEGATIVE Final   Influenza B by PCR NEGATIVE NEGATIVE Final    Comment: (NOTE) The Xpert Xpress SARS-CoV-2/FLU/RSV assay is intended as an aid in  the diagnosis of influenza from Nasopharyngeal swab specimens and  should not be used as a sole basis for treatment. Nasal washings and  aspirates are unacceptable for Xpert Xpress SARS-CoV-2/FLU/RSV   testing.  Fact Sheet for Patients: https://www.moore.com/  Fact Sheet for Healthcare Providers: https://www.young.biz/  This test is not yet approved or cleared by the [place] FDA and  has been authorized for detection and/or diagnosis of SARS-CoV-2 by  FDA under an Emergency Use Authorization (EUA). This EUA will remain  in effect (meaning this test can be used) for the duration of the  Covid-19 declaration under Section 564(b)(1) of the Act, 21  U.S.C. section 360bbb-3(b)(1), unless the authorization is  terminated or revoked. Performed at [facility], [address] [place], [place] [address]      Radiology Studies: No results found.  Scheduled Meds: . acidophilus  1 capsule Oral BID  . vitamin C  1,000 mg Oral Daily  . aspirin  81 mg Oral Daily  . baclofen  20 mg Oral TID  . bisacodyl  5 mg Oral BID  . bisacodyl  10 mg Rectal Once per day on Mon Wed Fri  . cephALEXin  250 mg Oral Q6H  . Chlorhexidine Gluconate Cloth  6 each Topical Daily  . docusate sodium  100 mg Oral BID  . heparin injection (subcutaneous)  5,000 Units Subcutaneous Q8H  . lactulose  30 g  Oral BID  . levothyroxine  12.5 mcg Oral Q0600  . lisinopril  10 mg Oral Daily  . morphine  15 mg Oral Daily  . morphine  7.5 mg Oral Daily  . neomycin-bacitracin-polymyxin  1 application Topical Daily  . omega-3 acid ethyl esters  1,000 mg Oral Daily  . oxybutynin  2.5 mg Oral TID  . pantoprazole  40 mg Oral Q0600  . polyethylene glycol  17 g Oral BID  . senna  2 tablet Oral QHS  . simvastatin  40 mg Oral Daily   Continuous Infusions: . sodium chloride 10 mL/hr at 04/25/20 0200     LOS: 7 days   Rickey Barbara, MD [facility] Pager On Amion  If 7PM-7AM, please contact night-coverage 04/28/2020, 5:30 PM

## 2020-04-29 LAB — COMPREHENSIVE METABOLIC PANEL
ALT: 51 U/L — ABNORMAL HIGH (ref 0–44)
AST: 61 U/L — ABNORMAL HIGH (ref 15–41)
Albumin: 3.5 g/dL (ref 3.5–5.0)
Alkaline Phosphatase: 66 U/L (ref 38–126)
Anion gap: 9 (ref 5–15)
BUN: 28 mg/dL — ABNORMAL HIGH (ref 8–23)
CO2: 26 mmol/L (ref 22–32)
Calcium: 8.9 mg/dL (ref 8.9–10.3)
Chloride: 102 mmol/L (ref 98–111)
Creatinine, Ser: 0.84 mg/dL (ref 0.44–1.00)
GFR, Estimated: >60 mL/min (ref >60)
Glucose, Bld: 127 mg/dL — ABNORMAL HIGH (ref 70–99)
Potassium: 4.6 mmol/L (ref 3.5–5.1)
Sodium: 137 mmol/L (ref 135–145)
Total Bilirubin: 0.6 mg/dL (ref 0.3–1.2)
Total Protein: 6.7 g/dL (ref 6.5–8.1)

## 2020-04-29 LAB — MAGNESIUM: Magnesium: 2.5 mg/dL — ABNORMAL HIGH (ref 1.7–2.4)

## 2020-04-29 LAB — PHOSPHORUS: Phosphorus: 4.1 mg/dL (ref 2.5–4.6)

## 2020-04-29 MED ORDER — CEPHALEXIN 250 MG PO CAPS: 250.0000 mg | ORAL_CAPSULE | Freq: Four times a day (QID) | ORAL | 0 refills | Status: AC

## 2020-04-29 MED ORDER — MORPHINE SULFATE 15 MG PO TABS: 7.5000 mg | ORAL_TABLET | Freq: Every day | ORAL | 0 refills | Status: AC | PRN

## 2020-04-29 MED ORDER — LISINOPRIL 10 MG PO TABS: 10.0000 mg | ORAL_TABLET | Freq: Every day | ORAL | 0 refills | Status: AC

## 2020-04-29 MED ORDER — SIMVASTATIN 40 MG PO TABS: 40.0000 mg | ORAL_TABLET | Freq: Every day | ORAL | 0 refills | Status: AC

## 2020-04-29 MED ORDER — MORPHINE SULFATE ER 15 MG PO TBCR: 15.0000 mg | EXTENDED_RELEASE_TABLET | Freq: Every day | ORAL | 0 refills | Status: AC

## 2020-04-29 MED ORDER — OXYBUTYNIN CHLORIDE 5 MG PO TABS
2.5000 mg | ORAL_TABLET | Freq: Three times a day (TID) | ORAL | 0 refills | Status: DC
Start: 2020-04-29 — End: 2020-04-29

## 2020-04-29 MED ORDER — OXYBUTYNIN CHLORIDE 5 MG PO TABS: 2.5000 mg | ORAL_TABLET | Freq: Three times a day (TID) | ORAL | 0 refills | Status: AC

## 2020-04-29 NOTE — Discharge Summary (Signed)
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     Status: None   Collection Time: 04/26/20 12:00 PM   Specimen: Urine, Clean Catch  Result Value Ref Range Status   Specimen Description   Final    URINE, CLEAN CATCH Performed at [facility], [address] [place], [place] [address]    Special Requests   Final    NONE Performed at [facility], [address] [place], [place] [address]    Culture   Final    NO GROWTH Performed at [facility], [address] [place], [place] [address]    Report Status 04/27/2020 FINAL  Final  Respiratory Panel by RT PCR (Flu A&B, Covid) - Nasopharyngeal Swab     Status: None   Collection Time: 04/28/20  1:50 PM   Specimen: Nasopharyngeal Swab  Result Value Ref Range Status   SARS Coronavirus 2 by RT PCR NEGATIVE NEGATIVE Final    Comment: (NOTE) SARS-CoV-2 target nucleic acids are NOT DETECTED.  The SARS-CoV-2 RNA is generally detectable in upper respiratoy specimens during the acute phase of infection. The lowest concentration of SARS-CoV-2 viral copies this assay can detect is 131 copies/mL. A negative result does not preclude SARS-Cov-2 infection and should not be used as the sole basis for treatment or other patient management decisions. A negative result may occur with  improper specimen collection/handling, submission of specimen other than nasopharyngeal swab, presence of viral mutation(s) within the areas targeted by this assay, and inadequate number of viral copies (<131 copies/mL). A negative result must be combined with clinical observations, patient history, and epidemiological information. The expected result is Negative.  Fact Sheet  for Patients:  https://www.moore.com/  Fact Sheet for Healthcare Providers:  https://www.young.biz/  This test is no t yet approved or cleared by the [place] FDA and  has been authorized for detection and/or diagnosis of SARS-CoV-2 by FDA under an Emergency Use Authorization (EUA). This EUA will remain  in effect (meaning this test can be used) for the duration of the COVID-19 declaration under Section 564(b)(1) of the Act, 21 U.S.C. section 360bbb-3(b)(1), unless the authorization is terminated or revoked sooner.     Influenza A by PCR NEGATIVE NEGATIVE Final   Influenza B by PCR NEGATIVE NEGATIVE Final    Comment: (NOTE) The Xpert Xpress SARS-CoV-2/FLU/RSV assay is intended as an aid in  the diagnosis of influenza from Nasopharyngeal swab specimens and  should not be used as a sole basis for treatment. Nasal washings and  aspirates are unacceptable for Xpert Xpress SARS-CoV-2/FLU/RSV  testing.  Fact Sheet for Patients: https://www.moore.com/  Fact Sheet for Healthcare Providers: https://www.young.biz/  This test is not yet approved or cleared by the [place] FDA and  has been authorized for detection and/or diagnosis of SARS-CoV-2 by  FDA under an Emergency Use Authorization (EUA). This EUA will remain  in effect (meaning this test can be used) for the duration of the  Covid-19 declaration under Section 564(b)(1) of the Act, 21  U.S.C. section 360bbb-3(b)(1), unless the authorization is  terminated or revoked. Performed at [facility], [address] [place], [place] [address]    Time spent: 30 min  SIGNED:   Rickey Barbara, MD  [facility] 04/29/2020, 10:22 AM  If 7PM-7AM, please contact night-coverage

## 2020-04-29 NOTE — Plan of Care (Signed)
  Problem: Education: Goal: Knowledge of General Education information will improve Description: Including pain rating scale, medication(s)/side effects and non-pharmacologic comfort measures 04/29/2020 1257 by Samaad Hashem, Petra Kuba, RN Outcome: Adequate for Discharge 04/29/2020 1257 by Christia Coaxum, Petra Kuba, RN Outcome: Progressing   Problem: Health Behavior/Discharge Planning: Goal: Ability to manage health-related needs will improve 04/29/2020 1257 by Tahmir Kleckner, Petra Kuba, RN Outcome: Adequate for Discharge 04/29/2020 1257 by Sameerah Nachtigal, Petra Kuba, RN Outcome: Progressing   Problem: Clinical Measurements: Goal: Ability to maintain clinical measurements within normal limits will improve 04/29/2020 1257 by Gloyd Happ, Petra Kuba, RN Outcome: Adequate for Discharge 04/29/2020 1257 by Minette Brine, RN Outcome: Progressing Goal: Will remain free from infection 04/29/2020 1257 by Sonna Lipsky, Petra Kuba, RN Outcome: Adequate for Discharge 04/29/2020 1257 by Minette Brine, RN Outcome: Progressing Goal: Diagnostic test results will improve 04/29/2020 1257 by Evanne Matsunaga, Petra Kuba, RN Outcome: Adequate for Discharge 04/29/2020 1257 by Minette Brine, RN Outcome: Progressing Goal: Respiratory complications will improve 04/29/2020 1257 by Minette Brine, RN Outcome: Adequate for Discharge 04/29/2020 1257 by Minette Brine, RN Outcome: Progressing Goal: Cardiovascular complication will be avoided 04/29/2020 1257 by Minette Brine, RN Outcome: Adequate for Discharge 04/29/2020 1257 by Minette Brine, RN Outcome: Progressing   Problem: Activity: Goal: Risk for activity intolerance will decrease 04/29/2020 1257 by Dorrine Montone, Petra Kuba, RN Outcome: Adequate for Discharge 04/29/2020 1257 by Chundra Sauerwein, Petra Kuba, RN Outcome: Progressing   Problem: Coping: Goal: Level of anxiety will decrease 04/29/2020 1257 by Cree Kunert, Petra Kuba, RN Outcome: Adequate for Discharge 04/29/2020 1257 by  Clemon Devaul, Petra Kuba, RN Outcome: Progressing   Problem: Elimination: Goal: Will not experience complications related to bowel motility 04/29/2020 1257 by Minette Brine, RN Outcome: Adequate for Discharge 04/29/2020 1257 by Minette Brine, RN Outcome: Progressing Goal: Will not experience complications related to urinary retention 04/29/2020 1257 by Theona Muhs, Petra Kuba, RN Outcome: Adequate for Discharge 04/29/2020 1257 by Daven Pinckney, Petra Kuba, RN Outcome: Progressing   Problem: Pain Managment: Goal: General experience of comfort will improve 04/29/2020 1257 by Minette Brine, RN Outcome: Adequate for Discharge 04/29/2020 1257 by Darothy Courtright, Petra Kuba, RN Outcome: Progressing

## 2020-04-29 NOTE — Progress Notes (Signed)
Patient report called to [facility]. Attempt x1 with no answer- left call back number.

## 2020-04-29 NOTE — Progress Notes (Signed)
Patient discharged in stable condition. No complaints at this time and denies pain. Is excited about going home.

## 2020-04-29 NOTE — TOC Transition Note (Signed)
Transition of Care [facility]) - CM/SW Discharge Note   Patient Details  Name: Janila Arrazola MRN: [id number] Date of Birth: [birth year]  [facility]) CM/SW Contact:  Amada Jupiter, LCSW Phone Number: 04/29/2020, 10:18 AM   Clinical Narrative:    Pt has been medically cleared for dc today and returning to ALF at [facility].  PTAR to transport.  No further TOC needs.   Final next level of care: Assisted Living Barriers to Discharge: Barriers Resolved   Patient Goals and CMS Choice Patient states their goals for this hospitalization and ongoing recovery are:: to be able to return to [facility]      Discharge Placement                Patient to be transferred to facility by: Pt to return to [facility]F via [facility]      Discharge Plan and Services                DME Arranged: N/A DME Agency: NA       HH Arranged: NA HH Agency: NA        Social Determinants of Health (SDOH) Interventions     Readmission Risk Interventions Readmission Risk Prevention Plan 04/22/2020  Post Dischage Appt Complete  Medication Screening Complete  Transportation Screening Complete  Some recent data might be hidden

## 2020-04-29 NOTE — NC FL2 (Signed)
MEDICAID FL2 LEVEL OF CARE SCREENING TOOL     IDENTIFICATION  Patient Name: Deanna Morgan Birthdate: [birth year] Sex: female Admission Date (Current Location): 04/21/2020  [facility] and [place] Number:  Producer, television/film/video and Address:  [facility],  [address] [place], [place] [address]      Provider Number: [id number]  Attending Physician Name and Address:  Jerald Kief, MD  Relative Name and Phone Number:       Current Level of Care: Hospital Recommended Level of Care: Assisted Living Facility Prior Approval Number:    Date Approved/Denied:   PASRR Number:    Discharge Plan: Other (Comment) (ALF)    Current Diagnoses: Patient Active Problem List   Diagnosis Date Noted  . Chronic diastolic CHF (congestive heart failure) (HCC) 04/25/2020  . Essential hypertension 04/25/2020  . Normocytic anemia 04/25/2020  . Cellulitis 04/21/2020  . Cellulitis of left upper extremity 04/21/2020  . 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 UTI (lower urinary tract infection) 06/22/2015  . Sepsis (HCC) 06/22/2015  . 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 Depression with anxiety 06/22/2015  . HLD (hyperlipidemia) 06/22/2015  . Slurred speech 06/22/2015  . NSTEMI (non-ST elevated myocardial infarction) (HCC)   . Adynamic ileus (HCC)   . Constipation   . Quadriparesis (HCC)   . Pressure ulcer 03/19/2015  . HCAP (healthcare-associated pneumonia)   . Chronic diastolic (congestive) heart failure (HCC)   . CN (constipation)   . Acute respiratory failure with hypoxemia (HCC)   . Aspiration pneumonia (HCC) 03/14/2015  . Abdominal pain, acute   . Arterial hypotension   . Hypoxia   . Ileus (HCC)   . Infection due to Port-A-Cath     Orientation RESPIRATION BLADDER Height & Weight     Self, Time, Situation, Place  Normal Indwelling catheter Weight: 165 lb 2 oz (74.9 kg) Height:  5\' 3"  (160 cm)  BEHAVIORAL SYMPTOMS/MOOD NEUROLOGICAL BOWEL NUTRITION STATUS       Continent Diet  AMBULATORY STATUS COMMUNICATION OF NEEDS Skin   Total Care Verbally Normal                       Personal Care Assistance Level of Assistance  Bathing, Dressing Bathing Assistance: Limited assistance   Dressing Assistance: Limited assistance     Functional Limitations Info             SPECIAL CARE FACTORS FREQUENCY                       Contractures      Additional Factors Info  Code Status, Allergies Code Status Info: DNR Allergies Info: see MAR           Current Medications (04/29/2020):  This is the current hospital active medication list Current Facility-Administered Medications  Medication Dose Route Frequency Provider Last Rate Last Admin  . 0.9 %  sodium chloride infusion   Intravenous PRN 05/01/2020, MD 10 mL/hr at 04/25/20 0200 Rate Verify at 04/25/20 0200  . acetaminophen (TYLENOL) tablet 650 mg  650 mg Oral Q6H PRN 04/27/20, MD   650 mg at 04/29/20 0211   Or  . acetaminophen (TYLENOL) suppository 650 mg  650 mg Rectal Q6H PRN 05/01/20, MD      . acidophilus (RISAQUAD) capsule 1 capsule  1 capsule Oral BID Eduard Clos, MD   1 capsule at 04/28/20 2324  . ascorbic acid (VITAMIN C) tablet  1,000 mg  1,000 mg Oral Daily Drema Dallas, MD   1,000 mg at 04/28/20 1914  . aspirin chewable tablet 81 mg  81 mg Oral Daily Eduard Clos, MD   81 mg at 04/28/20 7829  . baclofen (LIORESAL) tablet 20 mg  20 mg Oral TID Drema Dallas, MD   20 mg at 04/28/20 2324  . bisacodyl (DULCOLAX) EC tablet 5 mg  5 mg Oral BID Drema Dallas, MD   5 mg at 04/28/20 5621  . bisacodyl (DULCOLAX) suppository 10 mg  10 mg Rectal Once per day on Mon Wed Fri Drema Dallas, MD   10 mg at 04/25/20 1532  . cephALEXin (KEFLEX) capsule 250 mg  250 mg Oral Q6H Jerald Kief, MD   250 mg at 04/29/20 0701  . Chlorhexidine Gluconate Cloth 2 % PADS 6 each  6 each Topical Daily Drema Dallas, MD   6 each at 04/28/20 1022  .  docusate sodium (COLACE) capsule 100 mg  100 mg Oral BID Eduard Clos, MD   100 mg at 04/28/20 2324  . heparin injection 5,000 Units  5,000 Units Subcutaneous Q8H Eduard Clos, MD   5,000 Units at 04/29/20 [phone]  . lactulose (CHRONULAC) 10 GM/15ML solution 30 g  30 g Oral BID Drema Dallas, MD   30 g at 04/28/20 5784  . levothyroxine (SYNTHROID) tablet 12.5 mcg  12.5 mcg Oral Q0600 Drema Dallas, MD   12.5 mcg at 04/29/20 6962  . lisinopril (ZESTRIL) tablet 10 mg  10 mg Oral Daily Jerald Kief, MD   10 mg at 04/28/20 9528  . menthol-cetylpyridinium (CEPACOL) lozenge 3 mg  1 lozenge Oral PRN Drema Dallas, MD   3 mg at 04/28/20 2341  . morphine (MS CONTIN) 12 hr tablet 15 mg  15 mg Oral Daily Drema Dallas, MD   15 mg at 04/28/20 2324  . morphine (MSIR) tablet 7.5 mg  7.5 mg Oral Daily Drema Dallas, MD   7.5 mg at 04/28/20 4132  . neomycin-bacitracin-polymyxin (NEOSPORIN) ointment packet 1 application  1 application Topical Daily Drema Dallas, MD      . omega-3 acid ethyl esters (LOVAZA) capsule 1,000 mg  1,000 mg Oral Daily Drema Dallas, MD   1,000 mg at 04/28/20 4401  . oxybutynin (DITROPAN) tablet 2.5 mg  2.5 mg Oral TID Drema Dallas, MD   2.5 mg at 04/28/20 2324  . pantoprazole (PROTONIX) EC tablet 40 mg  40 mg Oral Q0600 Drema Dallas, MD   40 mg at 04/29/20 0272  . polyethylene glycol (MIRALAX / GLYCOLAX) packet 17 g  17 g Oral BID Drema Dallas, MD   17 g at 04/28/20 2324  . senna (SENOKOT) tablet 17.2 mg  2 tablet Oral QHS Drema Dallas, MD   17.2 mg at 04/28/20 2324  . simvastatin (ZOCOR) tablet 40 mg  40 mg Oral Daily Drema Dallas, MD   40 mg at 04/28/20 5366  . traZODone (DESYREL) tablet 50 mg  50 mg Oral QHS PRN Drema Dallas, MD   50 mg at 04/28/20 2324     Discharge Medications: Please see discharge summary for a list of discharge medications.  Relevant Imaging Results:  Relevant Lab Results:   Additional Information SS#  [id number]  Amada Jupiter, LCSW

## 2021-02-12 IMAGING — DX DG ABDOMEN 1V
2 series · 2 of 2 positions shown · non-contrast
Comparison: Acute abdominal series 09/27/2016

CLINICAL DATA: Abdominal distension.

EXAM:
ABDOMEN - 1 VIEW

[abdomen kub (1 of 2)]
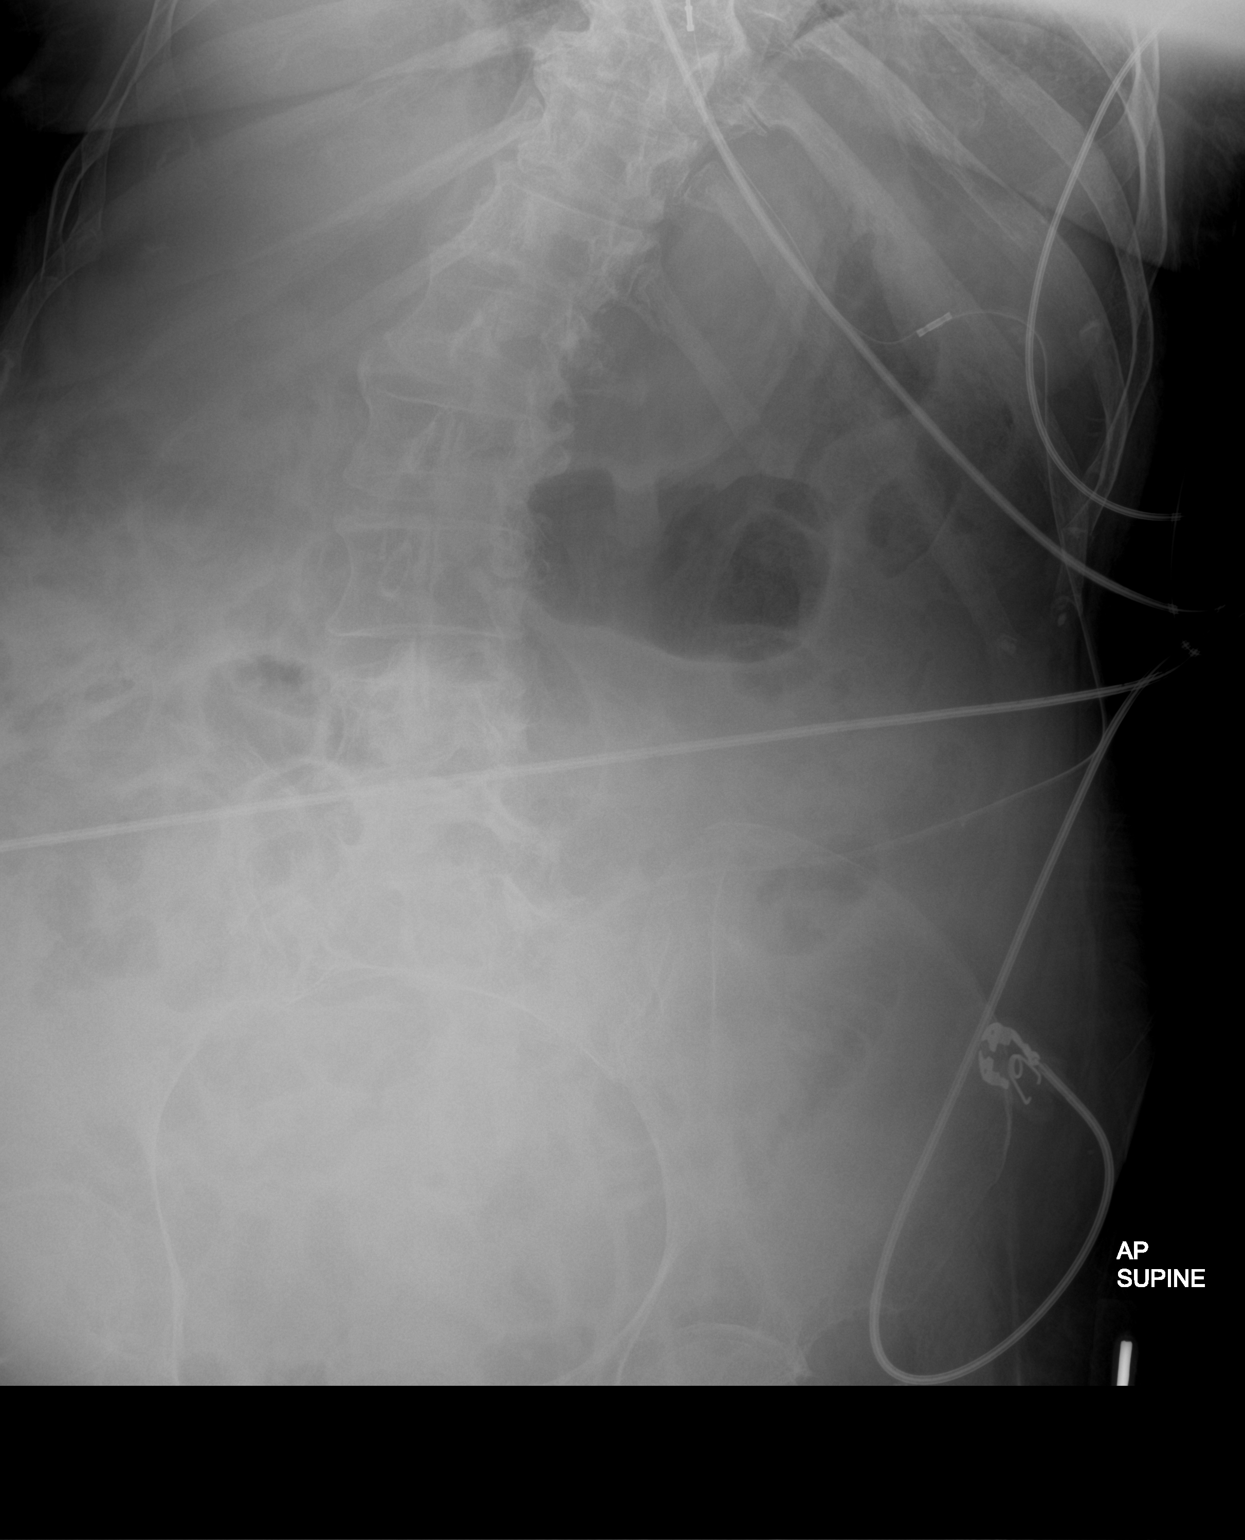

[abdomen kub (2 of 2)]
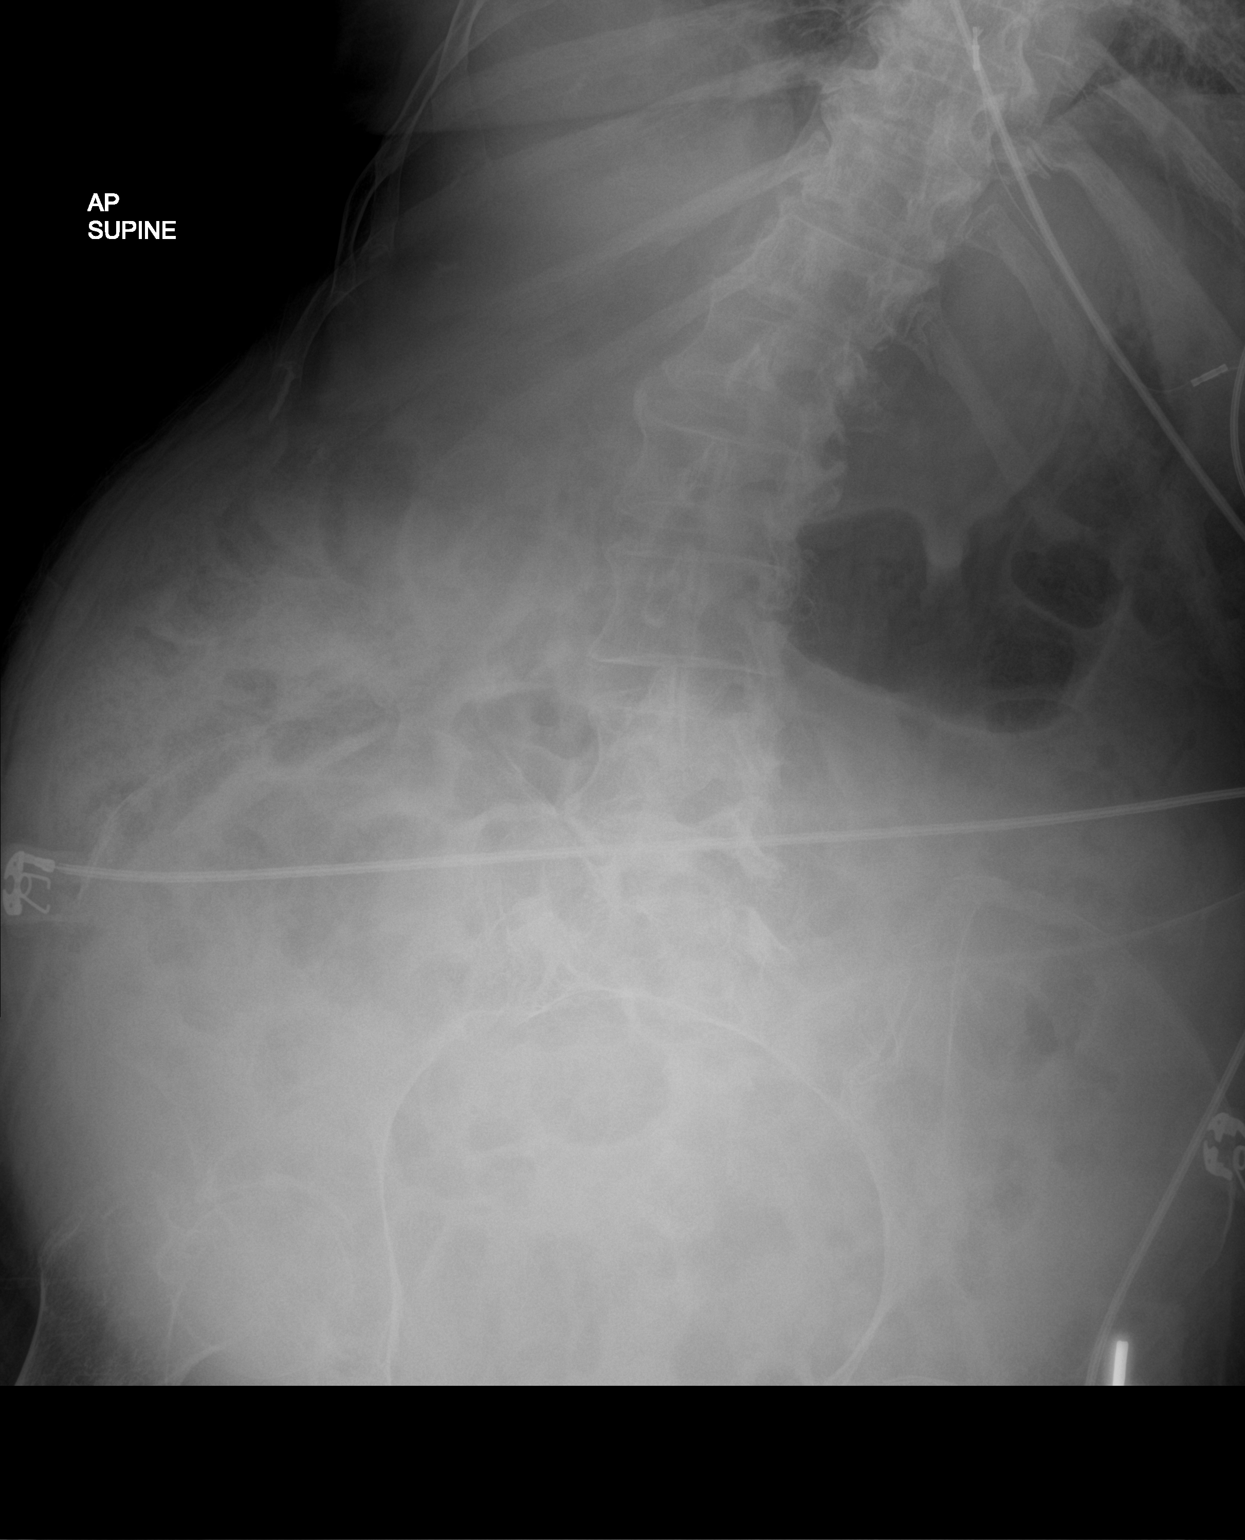

[2 of 2 positions shown; findings below may reference images not displayed]

FINDINGS: Bowel gas pattern is normal. Gas is present stomach. Small bowel and
colon are unremarkable. Definite free air is present. Rightward
curvature of the lumbar spine is again noted. Degenerative changes
are present in the hips.
IMPRESSION: 1. No acute abnormality or significant interval change.
2. Scoliosis.

## 2021-02-27 ENCOUNTER — Encounter (HOSPITAL_COMMUNITY): Payer: Self-pay | Admitting: *Deleted

## 2021-02-27 ENCOUNTER — Emergency Department (HOSPITAL_COMMUNITY)
Admission: EM | Admit: 2021-02-27 | Discharge: 2021-02-28 | Disposition: A | Discharge status: Home / Self Care | Payer: Medicare Other | Attending: Emergency Medicine | Admitting: Emergency Medicine

## 2021-02-27 ENCOUNTER — Emergency Department (HOSPITAL_COMMUNITY): Payer: Medicare Other

## 2021-02-27 DIAGNOSIS — I110 Hypertensive heart disease with heart failure: Secondary | ICD-10-CM | POA: Diagnosis not present

## 2021-02-27 DIAGNOSIS — Z7982 Long term (current) use of aspirin: Secondary | ICD-10-CM | POA: Insufficient documentation

## 2021-02-27 DIAGNOSIS — I5032 Chronic diastolic (congestive) heart failure: Secondary | ICD-10-CM | POA: Diagnosis not present

## 2021-02-27 DIAGNOSIS — Y9389 Activity, other specified: Secondary | ICD-10-CM | POA: Diagnosis not present

## 2021-02-27 DIAGNOSIS — S72352A Displaced comminuted fracture of shaft of left femur, initial encounter for closed fracture: Secondary | ICD-10-CM | POA: Insufficient documentation

## 2021-02-27 DIAGNOSIS — W1839XA Other fall on same level, initial encounter: Secondary | ICD-10-CM | POA: Insufficient documentation

## 2021-02-27 DIAGNOSIS — Z79899 Other long term (current) drug therapy: Secondary | ICD-10-CM | POA: Insufficient documentation

## 2021-02-27 DIAGNOSIS — S7292XA Unspecified fracture of left femur, initial encounter for closed fracture: Secondary | ICD-10-CM

## 2021-02-27 DIAGNOSIS — Y92129 Unspecified place in nursing home as the place of occurrence of the external cause: Secondary | ICD-10-CM | POA: Insufficient documentation

## 2021-02-27 MED ORDER — MORPHINE SULFATE ER 15 MG PO TBCR
15.0000 mg | EXTENDED_RELEASE_TABLET | Freq: Once | ORAL | Status: AC
  Administered 2021-02-28: 15 mg via ORAL
  Filled 2021-02-27: qty 1

## 2021-02-27 NOTE — ED Notes (Signed)
Pt was repositioned to comfort with RN and Tech.

## 2021-02-27 NOTE — ED Notes (Signed)
PTAR contacted, transport arranged.  

## 2021-02-27 NOTE — Discharge Instructions (Addendum)
Return for any problem.   Minimize transfers / movement of left leg for comfort.  Follow up with Dr. Charlann Boxer (orthopedics) as instructed.

## 2021-02-27 NOTE — ED Triage Notes (Signed)
Per EMS, pt from [facility], independent living. She fell on 02/17/21, she was transitioning from wheelchair and fell. They did an xray, showing left femur fracture. Hx quadriplegia. VSS, A&Ox4.

## 2021-02-27 NOTE — ED Notes (Signed)
Emptied pt's foley leg bag, recorded output.

## 2021-02-27 NOTE — ED Provider Notes (Signed)
[facility] Provider Note   CSN: [id number] Arrival date & time: 02/27/21  1424     History Chief Complaint  Patient presents with   Leg Pain    Femur fracture    [facility] is a 70 y.o. female.  70 year old female presents with left leg pain.  Patient states that she is nonambulatory and was being transferred in her wheelchair 10 days ago when she was dropped.  Complains of pain to her left thigh since that time.  Had an outpatient x-ray which showed a left femur fracture.  She is currently DNR.  Denies any other injuries at this time.      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   CHF (congestive heart failure) (HCC)    Chronic constipation    Chronic pain    Chronic UTI    GERD (gastroesophageal reflux disease)    Hyperlipidemia    Hypertension    Neuromuscular disorder (HCC)    Paralysis (HCC)    Peripheral vascular disease (HCC)    Shortness of breath dyspnea    Syringomyelia (HCC)     Patient Active Problem List   Diagnosis Date Noted   Chronic diastolic CHF (congestive heart failure) (HCC) 04/25/2020   Essential hypertension 04/25/2020   Normocytic anemia 04/25/2020   Cellulitis 04/21/2020   Cellulitis of left upper extremity 04/21/2020   Syringomyelia (HCC) 06/22/2015   UTI (lower urinary tract infection) 06/22/2015   Sepsis (HCC) 06/22/2015   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   Depression with anxiety 06/22/2015   HLD (hyperlipidemia) 06/22/2015   Slurred speech 06/22/2015   NSTEMI (non-ST elevated myocardial infarction) (HCC)    Adynamic ileus (HCC)    Constipation    Quadriparesis (HCC)    Pressure ulcer 03/19/2015   HCAP (healthcare-associated pneumonia)    Chronic diastolic (congestive) heart failure (HCC)    CN (constipation)    Acute respiratory failure with hypoxemia (HCC)    Aspiration pneumonia (HCC) 03/14/2015   Abdominal pain, acute     Arterial hypotension    Hypoxia    Ileus (HCC)    Infection due to Port-A-Cath     Past Surgical History:  Procedure Laterality Date   NECK SURGERY       OB History   No obstetric history on file.     Family History  Problem Relation Age of Onset   Heart failure Mother    Heart failure Father     Social History   Tobacco Use   Smoking status: Never   Smokeless tobacco: Never  Substance Use Topics   Alcohol use: No    Alcohol/week: 0.0 standard drinks   Drug use: No    Types: Morphine    Home Medications Prior to Admission medications   Medication Sig Start Date End Date Taking? Authorizing Provider  acetaminophen (TYLENOL) 500 MG tablet Take 500 mg by mouth every 6 (six) hours as needed for moderate pain.    [provider]  acidophilus (RISAQUAD) CAPS capsule Take 1 capsule by mouth 2 (two) times daily.    [provider]  Ascorbic Acid (VITAMIN C) 1000 MG tablet Take 1,000 mg by mouth daily.    [provider]  aspirin 81 MG tablet Take 81 mg by mouth daily.    [provider]  baclofen (LIORESAL) 20 MG tablet Take 20 mg by mouth 3 (three) times daily.     [provider]  bisacodyl (LAXATIVE) 10 MG suppository Place 10 mg rectally 3 (three) times a week. MWF    [provider]  Cranberry 425 MG CAPS Take 425 mg by mouth 2 (two) times daily.    [provider]  diphenhydrAMINE (BENADRYL) 25 MG tablet Take 12.5-25 mg by mouth See admin instructions. 12.5 Mg twice daily and 25 MG at bedtime For 7 days    [provider]  docusate sodium (COLACE) 100 MG capsule Take 100 mg by mouth 2 (two) times daily.    [provider]  lactulose, encephalopathy, (GENERLAC) 10 GM/15ML SOLN Take 30 g by mouth daily as needed (constipation).     [provider]  levothyroxine (SYNTHROID) 25 MCG tablet Take 12.5 mcg by mouth daily before breakfast.    [provider]  lisinopril (ZESTRIL)  10 MG tablet Take 1 tablet (10 mg total) by mouth daily. 04/29/20   Jerald Kief, MD  Magnesium Oxide (MAG-OX 400 PO) Take 1 tablet by mouth daily.    [provider]  methenamine (HIPREX) 1 g tablet Take 1 g by mouth 2 (two) times daily with a meal.    [provider]  morphine (MSIR) 15 MG tablet Take 0.5 tablets (7.5 mg total) by mouth daily as needed for severe pain. 04/29/20   Jerald Kief, MD  Multiple Vitamin (DAILY VITE PO) Take 1 tablet by mouth daily.    [provider]  neomycin-bacitracin-polymyxin (NEOSPORIN) [phone] ointment Apply 1 application topically daily.    [provider]  nystatin cream (MYCOSTATIN) Apply 1 application topically daily.     [provider]  Omega 3 1000 MG CAPS Take 1,000 mg by mouth daily.    [provider]  oxybutynin (DITROPAN) 5 MG tablet Take 0.5 tablets (2.5 mg total) by mouth 3 (three) times daily. 04/29/20   Jerald Kief, MD  pantoprazole (PROTONIX) 40 MG tablet Take 1 tablet (40 mg total) by mouth daily at 6 (six) AM. 06/25/15   Rodolph Bong, MD  polyethylene glycol [facility]) packet Take 17 g by mouth 2 (two) times daily. 09/30/16   Azalia Bilis, MD  psyllium (REGULOID) 0.52 g capsule Take 1.04 g by mouth daily.    [provider]  senna (SENOKOT) 8.6 MG TABS tablet Take 2 tablets by mouth at bedtime.     [provider]  simvastatin (ZOCOR) 40 MG tablet Take 1 tablet (40 mg total) by mouth daily. 04/30/20 05/30/20  Jerald Kief, MD    Allergies    Patient has no known allergies.  Review of Systems   Review of Systems  All other systems reviewed and are negative.  Physical Exam Updated Vital Signs BP (!) 105/59 (BP Location: Left Arm)   Pulse 77   Temp 98.6 F (37 C) (Oral)   Resp 16   SpO2 94%   Physical Exam Vitals and nursing note reviewed.  Constitutional:      General: She is not in acute distress.    Appearance: Normal  appearance. She is well-developed. She is not toxic-appearing.  HENT:     Head: Normocephalic and atraumatic.  Eyes:     General: Lids are normal.     Conjunctiva/sclera: Conjunctivae normal.     Pupils: Pupils are equal, round, and reactive to light.  Neck:     Thyroid: No thyroid mass.     Trachea: No tracheal deviation.  Cardiovascular:     Rate and  Rhythm: Normal rate and regular rhythm.     Heart sounds: Normal heart sounds. No murmur heard.   No gallop.  Pulmonary:     Effort: Pulmonary effort is normal. No respiratory distress.     Breath sounds: Normal breath sounds. No stridor. No decreased breath sounds, wheezing, rhonchi or rales.  Abdominal:     General: There is no distension.     Palpations: Abdomen is soft.     Tenderness: There is no abdominal tenderness. There is no rebound.  Musculoskeletal:        General: No tenderness. Normal range of motion.     Cervical back: Normal range of motion and neck supple.       Legs:  Skin:    General: Skin is warm and dry.     Findings: No abrasion or rash.  Neurological:     Mental Status: She is alert and oriented to person, place, and time. Mental status is at baseline.     GCS: GCS eye subscore is 4. GCS verbal subscore is 5. GCS motor subscore is 6.     Cranial Nerves: Cranial nerves are intact. No cranial nerve deficit.     Sensory: No sensory deficit.     Motor: Motor function is intact.  Psychiatric:        Attention and Perception: Attention normal.        Speech: Speech normal.        Behavior: Behavior normal.    ED Results / Procedures / Treatments   Labs (all labs ordered are listed, but only abnormal results are displayed) Labs Reviewed - No data to display  EKG None  Radiology No results found.  Procedures Procedures   Medications Ordered in ED Medications - No data to display  ED Course  I have reviewed the triage vital signs and the nursing notes.  Pertinent labs & imaging results that  were available during my care of the patient were reviewed by me and considered in my medical decision making (see chart for details).    MDM Rules/Calculators/A&P                           X-rays pending.  Signout to Dr. Rodena Medin Final Clinical Impression(s) / ED Diagnoses Final diagnoses:  None    Rx / DC Orders ED Discharge Orders     None        Lorre Nick, MD 02/27/21 [phone]

## 2021-02-27 NOTE — ED Provider Notes (Signed)
Patient seen after prior ED provider.  Patient with left femur fracture that occurred 10 days prior.  Patient is nonambulatory at baseline.  Case discussed with Dr. Charlann Boxer of orthopedics.  He agrees that nonoperative management would be appropriate.  Patient is comfortable at time of discharge.  She understands need for close follow-up in the outpatient setting.  Strict return precautions given and understood.   Wynetta Fines, MD 02/27/21 [phone]

## 2022-11-21 ENCOUNTER — Other Ambulatory Visit: Payer: Self-pay | Admitting: Family Medicine

## 2022-11-21 DIAGNOSIS — Z0000 Encounter for general adult medical examination without abnormal findings: Secondary | ICD-10-CM

## 2022-12-25 ENCOUNTER — Ambulatory Visit
Admission: RE | Admit: 2022-12-25 | Discharge: 2022-12-25 | Disposition: A | Discharge status: Home / Self Care | Payer: Medicare Other | Source: Ambulatory Visit | Attending: Family Medicine | Admitting: Family Medicine

## 2022-12-25 DIAGNOSIS — Z0000 Encounter for general adult medical examination without abnormal findings: Secondary | ICD-10-CM

## 2023-05-24 ENCOUNTER — Other Ambulatory Visit: Payer: Self-pay

## 2023-05-24 DIAGNOSIS — G629 Polyneuropathy, unspecified: Secondary | ICD-10-CM

## 2023-06-03 ENCOUNTER — Ambulatory Visit (HOSPITAL_COMMUNITY)
Admission: RE | Admit: 2023-06-03 | Discharge: 2023-06-03 | Disposition: A | Discharge status: Home / Self Care | Payer: Medicare Other | Source: Ambulatory Visit | Attending: Surgery | Admitting: Surgery

## 2023-06-03 DIAGNOSIS — G629 Polyneuropathy, unspecified: Secondary | ICD-10-CM

## 2023-06-03 LAB — VAS US ABI WITH/WO TBI
Left ABI: 1.23
Right ABI: 1.22

## 2023-06-04 ENCOUNTER — Encounter (HOSPITAL_COMMUNITY): Payer: Medicare Other

## 2023-06-12 ENCOUNTER — Encounter: Payer: Self-pay | Admitting: Vascular Surgery

## 2023-06-12 ENCOUNTER — Ambulatory Visit (INDEPENDENT_AMBULATORY_CARE_PROVIDER_SITE_OTHER): Payer: Medicare Other | Admitting: Vascular Surgery

## 2023-06-12 VITALS — BP 177/73 | HR 65 | Temp 97.6°F

## 2023-06-12 DIAGNOSIS — G629 Polyneuropathy, unspecified: Secondary | ICD-10-CM

## 2023-06-12 NOTE — Progress Notes (Signed)
Patient ID: Deanna Morgan, female   DOB: 1950-09-10, 72 y.o.   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  She states that she wears compression stockings most of the day and keeps her legs elevated.  She does have discoloration of her legs particularly when they are dependent but has no tissue loss or ulceration.  She does not walk and has minimal lower extremity motor function.  She has never had any vascular intervention.  She states that she does not have any leg pain but does have foot numbness and tingling which has progressively gotten worse with time.  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   CHF (congestive heart failure) (HCC)    Chronic constipation    Chronic pain    Chronic UTI    GERD (gastroesophageal reflux disease)    Hyperlipidemia    Hypertension    Neuromuscular disorder (HCC)    Paralysis (HCC)    Peripheral vascular disease (HCC)    Shortness of breath dyspnea    Syringomyelia (HCC)    Family History  Problem Relation Age of Onset   Heart failure Mother    Heart failure Father    Past Surgical History:  Procedure Laterality Date   NECK SURGERY      Short Social History:  Social History   Tobacco Use   Smoking status: Never   Smokeless tobacco: Never  Substance Use Topics   Alcohol use: No    Alcohol/week: 0.0 standard drinks of alcohol    No Known Allergies  Current Outpatient Medications  Medication Sig Dispense Refill   acetaminophen (TYLENOL) 500 MG tablet Take 500 mg by mouth every 6 (six) hours as needed for moderate pain.     acidophilus (RISAQUAD) CAPS capsule Take 1 capsule by mouth 2 (two) times daily.     Ascorbic Acid (VITAMIN C) 1000 MG tablet Take 1,000 mg by mouth  daily.     aspirin 81 MG tablet Take 81 mg by mouth daily.     baclofen (LIORESAL) 20 MG tablet Take 20 mg by mouth 3 (three) times daily.      bisacodyl (LAXATIVE) 10 MG suppository Place 10 mg rectally 3 (three) times a week. MWF     Cranberry 425 MG CAPS Take 425 mg by mouth 2 (two) times daily.     diphenhydrAMINE (BENADRYL) 25 MG tablet Take 12.5-25 mg by mouth See admin instructions. 12.5 Mg twice daily and 25 MG at bedtime For 7 days     docusate sodium (COLACE) 100 MG capsule Take 100 mg by mouth 2 (two) times daily.     lactulose, encephalopathy, (GENERLAC) 10 GM/15ML SOLN Take 30 g by mouth daily as needed (constipation).      levothyroxine (SYNTHROID) 25 MCG tablet Take 12.5 mcg by mouth daily before breakfast.     lisinopril (ZESTRIL) 10 MG tablet Take 1 tablet (10 mg total) by mouth daily. 30 tablet 0   Magnesium Oxide (MAG-OX 400 PO) Take 1 tablet by mouth daily.     methenamine (HIPREX) 1 g tablet Take 1 g by mouth 2 (two) times daily with a meal.     morphine (MS CONTIN) 15 MG 12 hr tablet Take 15  mg by mouth 2 (two) times daily as needed for pain.     morphine (MSIR) 15 MG tablet Take 0.5 tablets (7.5 mg total) by mouth daily as needed for severe pain. 3 tablet 0   Multiple Vitamin (DAILY VITE PO) Take 1 tablet by mouth daily.     neomycin-bacitracin-polymyxin (NEOSPORIN) [phone] ointment Apply 1 application topically daily.     nystatin cream (MYCOSTATIN) Apply 1 application topically daily.      Omega 3 1000 MG CAPS Take 1,000 mg by mouth daily.     oxybutynin (DITROPAN) 5 MG tablet Take 0.5 tablets (2.5 mg total) by mouth 3 (three) times daily. 30 tablet 0   pantoprazole (PROTONIX) 40 MG tablet Take 1 tablet (40 mg total) by mouth daily at 6 (six) AM. 30 tablet 0   polyethylene glycol (MIRALAX / GLYCOLAX) packet Take 17 g by mouth 2 (two) times daily. 14 each 0   psyllium (REGULOID) 0.52 g capsule Take 1.04 g by mouth daily.     senna (SENOKOT) 8.6 MG TABS tablet Take 2  tablets by mouth at bedtime.      simvastatin (ZOCOR) 40 MG tablet Take 1 tablet (40 mg total) by mouth daily. 30 tablet 0   No current facility-administered medications for this visit.    Review of Systems  Constitutional:  Constitutional negative. HENT: HENT negative.  Eyes: Eyes negative.  Respiratory: Respiratory negative.  Cardiovascular: Cardiovascular negative.  GI: Gastrointestinal negative.  Musculoskeletal: Positive for leg pain.  Skin: Skin negative.  Neurological: Positive for focal weakness and numbness.  Hematologic: Hematologic/lymphatic negative.  Psychiatric: Psychiatric negative.        Objective:  Objective   Vitals:   06/12/23 1026  BP: (!) 177/73  Pulse: 65  Temp: 97.6 F (36.4 C)   There is no height or weight on file to calculate BMI.  Physical Exam HENT:     Head: Normocephalic.     Nose: Nose normal.     Mouth/Throat:     Mouth: Mucous membranes are moist.  Eyes:     Pupils: Pupils are equal, round, and reactive to light.  Cardiovascular:     Rate and Rhythm: Normal rate.     Pulses: Normal pulses.  Pulmonary:     Effort: Pulmonary effort is normal.  Abdominal:     General: Abdomen is flat.  Musculoskeletal:     Cervical back: Normal range of motion and neck supple.     Comments: Wearing bilateral lower extremity tight compressive stockings  Neurological:     Mental Status: She is alert. Mental status is at baseline.  Psychiatric:        Mood and Affect: Mood normal.     Data: ABI Findings:  +---------+------------------+-----+-----------+----------------+  Right   Rt Pressure (mmHg)IndexWaveform   Comment           +---------+------------------+-----+-----------+----------------+  Brachial 133                                                 +---------+------------------+-----+-----------+----------------+  PTA     169               1.22 multiphasicAudible biphasic   +---------+------------------+-----+-----------+----------------+  DP      163               1.17 multiphasic                  +---------+------------------+-----+-----------+----------------+  Great Toe107               0.77 Normal                       +---------+------------------+-----+-----------+----------------+   +---------+------------------+-----+--------+----------------+  Left    Lt Pressure (mmHg)IndexWaveformComment           +---------+------------------+-----+--------+----------------+  Brachial 139                                              +---------+------------------+-----+--------+----------------+  PTA     171               1.23 biphasicAudible biphasic  +---------+------------------+-----+--------+----------------+  DP      164               1.18 biphasic                  +---------+------------------+-----+--------+----------------+  Kerby Nora               0.84 Normal                    +---------+------------------+-----+--------+----------------+   +-------+-----------+-----------+------------+------------+  ABI/TBIToday's ABIToday's TBIPrevious ABIPrevious TBI  +-------+-----------+-----------+------------+------------+  Right 1.22       0.77                                 +-------+-----------+-----------+------------+------------+  Left  1.23       0.84                                 +-------+-----------+-----------+------------+------------+        PPG tracings display appropriate pulsatility.    Summary:  Right: Resting right ankle-brachial index is within normal range. The  right toe-brachial index is normal.   Left: Resting left ankle-brachial index is within normal range. The left  toe-brachial index is normal.      Assessment/Plan:    72 year old female with numbness and tingling of her feet which is likely neurogenic in nature with palpable pulses and preserved ABIs.  She  continues to wear compression stockings and the discoloration in her forefeet is likely due to immobility and dependent edema.  Symptoms appear to be from neuropathy.  She can see me on an as-needed basis.   Maeola Harman MD [facility]

## 2023-10-04 ENCOUNTER — Emergency Department (HOSPITAL_COMMUNITY)

## 2023-10-04 ENCOUNTER — Emergency Department (HOSPITAL_COMMUNITY)
Admission: EM | Admit: 2023-10-04 | Discharge: 2023-10-04 | Disposition: A | Discharge status: Home / Self Care | Attending: Emergency Medicine | Admitting: Emergency Medicine

## 2023-10-04 ENCOUNTER — Other Ambulatory Visit: Payer: Self-pay

## 2023-10-04 DIAGNOSIS — I5032 Chronic diastolic (congestive) heart failure: Secondary | ICD-10-CM | POA: Insufficient documentation

## 2023-10-04 DIAGNOSIS — Z79899 Other long term (current) drug therapy: Secondary | ICD-10-CM | POA: Diagnosis not present

## 2023-10-04 DIAGNOSIS — Z7982 Long term (current) use of aspirin: Secondary | ICD-10-CM | POA: Diagnosis not present

## 2023-10-04 DIAGNOSIS — S0990XA Unspecified injury of head, initial encounter: Secondary | ICD-10-CM | POA: Diagnosis present

## 2023-10-04 DIAGNOSIS — S0181XA Laceration without foreign body of other part of head, initial encounter: Secondary | ICD-10-CM | POA: Diagnosis not present

## 2023-10-04 DIAGNOSIS — W050XXA Fall from non-moving wheelchair, initial encounter: Secondary | ICD-10-CM | POA: Diagnosis not present

## 2023-10-04 DIAGNOSIS — Z23 Encounter for immunization: Secondary | ICD-10-CM | POA: Diagnosis not present

## 2023-10-04 DIAGNOSIS — I110 Hypertensive heart disease with heart failure: Secondary | ICD-10-CM | POA: Diagnosis not present

## 2023-10-04 DIAGNOSIS — S0191XA Laceration without foreign body of unspecified part of head, initial encounter: Secondary | ICD-10-CM

## 2023-10-04 MED ORDER — ACETAMINOPHEN 500 MG PO TABS
1000.0000 mg | ORAL_TABLET | Freq: Once | ORAL | Status: AC
  Administered 2023-10-04: 1000 mg via ORAL
  Filled 2023-10-04: qty 2

## 2023-10-04 MED ORDER — TETANUS-DIPHTH-ACELL PERTUSSIS 5-2.5-18.5 LF-MCG/0.5 IM SUSY
0.5000 mL | PREFILLED_SYRINGE | Freq: Once | INTRAMUSCULAR | Status: AC
  Administered 2023-10-04: 0.5 mL via INTRAMUSCULAR
  Filled 2023-10-04: qty 0.5

## 2023-10-04 MED ORDER — LIDOCAINE-EPINEPHRINE-TETRACAINE (LET) TOPICAL GEL
3.0000 mL | Freq: Once | TOPICAL | Status: AC
  Administered 2023-10-04: 3 mL via TOPICAL
  Filled 2023-10-04: qty 3

## 2023-10-04 NOTE — ED Provider Notes (Signed)
 [place] EMERGENCY DEPARTMENT AT [facility] Provider Note  CSN: [id number] Arrival date & time: 10/04/23 1609  Chief Complaint(s) Head Laceration  HPI Deanna Morgan is a 73 y.o. female who is here today after she struck her head.  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 By staff. Patient rolled down an incline and struck a wall.  She struck her head.  She is not on a blood thinner.  No loss of consciousness.  Patient paraplegic following accident and subsequent complication more than 20 years ago.   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   CHF (congestive heart failure) (HCC)    Chronic constipation    Chronic pain    Chronic UTI    GERD (gastroesophageal reflux disease)    Hyperlipidemia    Hypertension    Neuromuscular disorder (HCC)    Paralysis (HCC)    Peripheral vascular disease (HCC)    Shortness of breath dyspnea    Syringomyelia (HCC)    Patient Active Problem List   Diagnosis Date Noted   Chronic diastolic CHF (congestive heart failure) (HCC) 04/25/2020   Essential hypertension 04/25/2020   Normocytic anemia 04/25/2020   Cellulitis 04/21/2020   Cellulitis of left upper extremity 04/21/2020   Syringomyelia (HCC) 06/22/2015   Lower urinary tract infectious disease 06/22/2015   Sepsis (HCC) 06/22/2015   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   Depression with anxiety 06/22/2015   HLD (hyperlipidemia) 06/22/2015   Slurred speech 06/22/2015   NSTEMI (non-ST elevated myocardial infarction) (HCC)    Adynamic ileus (HCC)    Constipation    Quadriparesis (HCC)    Pressure ulcer 03/19/2015   HCAP (healthcare-associated pneumonia)    Chronic diastolic (congestive) heart failure (HCC)    Constipation    Acute respiratory failure with hypoxemia (HCC)    Aspiration pneumonia (HCC) 03/14/2015   Abdominal pain, acute    Arterial hypotension     Hypoxia    Ileus (HCC)    Infection due to Port-A-Cath    Home Medication(s) Prior to Admission medications   Medication Sig Start Date End Date Taking? Authorizing Provider  acetaminophen (TYLENOL) 500 MG tablet Take 500 mg by mouth every 6 (six) hours as needed for moderate pain.    [provider]  acidophilus (RISAQUAD) CAPS capsule Take 1 capsule by mouth 2 (two) times daily.    [provider]  Ascorbic Acid (VITAMIN C) 1000 MG tablet Take 1,000 mg by mouth daily.    [provider]  aspirin 81 MG tablet Take 81 mg by mouth daily.    [provider]  baclofen (LIORESAL) 20 MG tablet Take 20 mg by mouth 3 (three) times daily.     [provider]  bisacodyl (LAXATIVE) 10 MG suppository Place 10 mg rectally 3 (three) times a week. MWF    [provider]  Cranberry 425 MG CAPS Take 425 mg by mouth 2 (two) times daily.    [provider]  diphenhydrAMINE (BENADRYL) 25 MG tablet Take 12.5-25 mg by mouth See admin instructions. 12.5 Mg twice daily and 25 MG at bedtime For 7 days    [provider]  docusate sodium (COLACE) 100 MG capsule Take 100 mg by mouth 2 (two) times daily.    [provider]  lactulose, encephalopathy, (GENERLAC) 10 GM/15ML SOLN Take 30 g by mouth daily as needed (constipation).  [provider]  levothyroxine (SYNTHROID) 25 MCG tablet Take 12.5 mcg by mouth daily before breakfast.    [provider]  lisinopril (ZESTRIL) 10 MG tablet Take 1 tablet (10 mg total) by mouth daily. 04/29/20   Jerald Kief, MD  Magnesium Oxide (MAG-OX 400 PO) Take 1 tablet by mouth daily.    [provider]  methenamine (HIPREX) 1 g tablet Take 1 g by mouth 2 (two) times daily with a meal.    [provider]  morphine (MS CONTIN) 15 MG 12 hr tablet Take 15 mg by mouth 2 (two) times daily as needed for pain. 02/07/21   [provider]  morphine (MSIR) 15 MG tablet  Take 0.5 tablets (7.5 mg total) by mouth daily as needed for severe pain. 04/29/20   Jerald Kief, MD  Multiple Vitamin (DAILY VITE PO) Take 1 tablet by mouth daily.    [provider]  neomycin-bacitracin-polymyxin (NEOSPORIN) [phone] ointment Apply 1 application topically daily.    [provider]  nystatin cream (MYCOSTATIN) Apply 1 application topically daily.     [provider]  Omega 3 1000 MG CAPS Take 1,000 mg by mouth daily.    [provider]  oxybutynin (DITROPAN) 5 MG tablet Take 0.5 tablets (2.5 mg total) by mouth 3 (three) times daily. 04/29/20   Jerald Kief, MD  pantoprazole (PROTONIX) 40 MG tablet Take 1 tablet (40 mg total) by mouth daily at 6 (six) AM. 06/25/15   Rodolph Bong, MD  polyethylene glycol [facility] Ethelene Hal) packet Take 17 g by mouth 2 (two) times daily. 09/30/16   Azalia Bilis, MD  psyllium (REGULOID) 0.52 g capsule Take 1.04 g by mouth daily.    [provider]  senna (SENOKOT) 8.6 MG TABS tablet Take 2 tablets by mouth at bedtime.     [provider]  simvastatin (ZOCOR) 40 MG tablet Take 1 tablet (40 mg total) by mouth daily. 04/30/20 05/30/20  Jerald Kief, MD                                                                                                                                    Past Surgical History Past Surgical History:  Procedure Laterality Date   NECK SURGERY     Family History Family History  Problem Relation Age of Onset   Heart failure Mother    Heart failure Father     Social History Social History   Tobacco Use   Smoking status: Never   Smokeless tobacco: Never  Substance Use Topics   Alcohol use: No    Alcohol/week: 0.0 standard drinks of alcohol   Drug use: No    Types: Morphine   Allergies Patient has no known allergies.  Review of Systems Review of Systems  Physical Exam Vital Signs  I have reviewed the triage vital signs BP (!) 179/101 (BP  Location: Right  Arm)   Pulse 67   Temp 98.5 F (36.9 C) (Oral)   Resp 16   SpO2 90%   Physical Exam Vitals reviewed.  HENT:     Head: Normocephalic.     Nose: Nose normal.  Eyes:     Pupils: Pupils are equal, round, and reactive to light.  Cardiovascular:     Rate and Rhythm: Normal rate.  Pulmonary:     Effort: Pulmonary effort is normal.  Abdominal:     General: Abdomen is flat.     Palpations: Abdomen is soft.  Musculoskeletal:     Cervical back: Normal range of motion and neck supple. No rigidity.  Lymphadenopathy:     Cervical: No cervical adenopathy.  Skin:    Comments: There is a 4.2 cm laceration over the middle of the forehead.  Small venous bleeding.  No visible bone.  There is additional 1 cm linear laceration on the forehead lateral to the above.  Small venous bleeding, no visible bone.  Neurological:     Mental Status: She is alert.     ED Results and Treatments Labs (all labs ordered are listed, but only abnormal results are displayed) Labs Reviewed - No data to display                                                                                                                        Radiology CT Cervical Spine Wo Contrast Result Date: 10/04/2023 CLINICAL DATA:  Ataxia, cervical trauma. Wheelchair rolled into a wall. History of quadriplegia. EXAM: CT CERVICAL SPINE WITHOUT CONTRAST TECHNIQUE: Multidetector CT imaging of the cervical spine was performed without intravenous contrast. Multiplanar CT image reconstructions were also generated. RADIATION DOSE REDUCTION: This exam was performed according to the departmental dose-optimization program which includes automated exposure control, adjustment of the mA and/or kV according to patient size and/or use of iterative reconstruction technique. COMPARISON:  None Available. FINDINGS: Alignment: Straightening of the normal cervical lordosis. 3 mm anterolisthesis of C2 on C3. 6 mm anterolisthesis of C4 on C5. Skull  base and vertebrae: No acute fracture. Advanced bilateral C1-2 arthropathy. Interbody and facet ankylosis at C3-4 and from C5-T1. C5-T1 laminectomies. Soft tissues and spinal canal: No prevertebral fluid or swelling. No visible canal hematoma. Partially visualized intrathecal catheter in the upper thoracic spine terminating at T1 with a separate catheter fragment or dorsally in the spinal canal extending from T1-C5. Disc levels: Advanced disc and facet degeneration at C4-5 with grade 2 anterolisthesis if and spurring resulting in suspected severe spinal stenosis just above the superior aspect of the posterior decompression as well as severe bilateral neural foraminal stenosis. Asymmetrically advanced neural foraminal stenosis on the right at C2-3 and C3-4 due to uncovertebral and facet spurring. Upper chest: Mild interlobular septal thickening and ground-glass opacity in the included lung apices. Other: None. IMPRESSION: 1. No acute cervical spine fracture. 2. Advanced disc and facet degeneration at C4-5 with grade 2 anterolisthesis and suspected severe spinal and neural foraminal  stenosis. 3. C5-T1 posterior decompression with solid fusion. Electronically Signed   By: Sebastian Ache M.D.   On: 10/04/2023 18:26   DG Chest Portable 1 View Result Date: 10/04/2023 CLINICAL DATA:  Hypoxia EXAM: PORTABLE CHEST 1 VIEW COMPARISON:  09/27/2016 FINDINGS: Heart is borderline in size. Mediastinal contours are within normal limits. Interstitial prominence in the lungs, most pronounced in the lower lobes could reflect chronic lung disease or edema. No visible effusions. No acute bony abnormality. Chronic bilateral shoulder dislocation, unchanged. Prior right shoulder replacement. IMPRESSION: Borderline heart size. Diffuse interstitial prominence, most pronounced in the lower lungs may reflect chronic lung disease or edema. Electronically Signed   By: Charlett Nose M.D.   On: 10/04/2023 18:11   CT Head Wo Contrast Result Date:  10/04/2023 CLINICAL DATA:  Ataxia, head trauma. Wheelchair rolled into wall. Laceration to forehead. EXAM: CT HEAD WITHOUT CONTRAST TECHNIQUE: Contiguous axial images were obtained from the base of the skull through the vertex without intravenous contrast. RADIATION DOSE REDUCTION: This exam was performed according to the departmental dose-optimization program which includes automated exposure control, adjustment of the mA and/or kV according to patient size and/or use of iterative reconstruction technique. COMPARISON:  None Available. FINDINGS: Brain: No acute intracranial abnormality. Specifically, no hemorrhage, hydrocephalus, mass lesion, acute infarction, or significant intracranial injury. Vascular: No hyperdense vessel or unexpected calcification. Skull: No acute calvarial abnormality. Sinuses/Orbits: No acute findings Other: Soft tissue swelling and gas in the forehead soft tissues compatible with laceration. IMPRESSION: No acute intracranial abnormality.  Fall Electronically Signed   By: Charlett Nose M.D.   On: 10/04/2023 17:25    Pertinent labs & imaging results that were available during my care of the patient were reviewed by me and considered in my medical decision making (see MDM for details).  Medications Ordered in ED Medications  lidocaine-EPINEPHrine-tetracaine (LET) topical gel (3 mLs Topical Given 10/04/23 1638)  Tdap (BOOSTRIX) injection 0.5 mL (0.5 mLs Intramuscular Given 10/04/23 1638)  acetaminophen (TYLENOL) tablet 1,000 mg (1,000 mg Oral Given 10/04/23 1643)                                                                                                                                     Procedures .Laceration Repair  Date/Time: 10/04/2023 6:18 PM  Performed by: Arletha Pili, DO Authorized by: Arletha Pili, DO   Consent:    Consent obtained:  Verbal   Consent given by:  Patient Laceration details:    Length (cm):  5.2 Repair type:    Repair type:   Intermediate Comments:     Area anesthetized using let gel.  Both wounds then irrigated with 20 cc of sterile saline.  On the 4.2 cm laceration, I placed 6 simple interrupted 5-0 Prolene sutures.  There was a triangular flap which added to the complexity of the wound.  Hemostasis was achieved with good wound eversion.  41.0 cm laceration  I placed 2 simple interrupted 5-0 Prolene sutures.  Hemostasis was achieved with good wound eversion.  Patient tolerated procedure well.   (including critical care time)  Medical Decision Making / ED Course   This patient presents to the ED for concern of head strike, laceration, this involves an extensive number of treatment options, and is a complaint that carries with it a high risk of complications and morbidity.  The differential diagnosis includes head trauma, laceration, intracranial hemorrhage  MDM: Will obtain CT imaging of patient's head and neck.  Patient otherwise feeling well, had been feeling fine prior to the accident.  No indication for labs.  Will plan for wound closure.  Reassessment 6:20 PM-wound closed.  Patient has intermittently had some low O2 readings.  Sat the patient up higher in the bed, O2 sats improved to 92 to 94%.  Pulse ox moved to ER, patient began satting 95 to 97%.  Patient does not wear oxygen at baseline.  She says that she does not feel short of breath, she states her oxygen is often on the low side.  Her chest x-ray shows questionable edema versus chronic lung disease.  Normal lung sounds.  Patient 95 to 97%, talking on the phone, and speaking to me complete sentences without any shortness of breath.  She has received Tdap.  Will discharge patient back to her skilled nursing facility.     Additional history obtained: -Additional history obtained from  -External records from outside source obtained and reviewed including: Chart review including previous notes, labs, imaging, consultation notes   Lab Tests: -I  ordered, reviewed, and interpreted labs.   The pertinent results include:   Labs Reviewed - No data to display    EKG   EKG Interpretation Date/Time:    Ventricular Rate:    PR Interval:    QRS Duration:    QT Interval:    QTC Calculation:   R Axis:      Text Interpretation:           Imaging Studies ordered: I ordered imaging studies including  I independently visualized and interpreted imaging. I agree with the radiologist interpretation   Medicines ordered and prescription drug management: Meds ordered this encounter  Medications   lidocaine-EPINEPHrine-tetracaine (LET) topical gel   Tdap (BOOSTRIX) injection 0.5 mL   acetaminophen (TYLENOL) tablet 1,000 mg    -I have reviewed the patients home medicines and have made adjustments as needed    Social Determinants of Health:  Factors impacting patients care include: Nursing home resident, multiple medical comorbidities including paralysis.   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  Appropriate for outpatient follow-up.     Final Clinical Impression(s) / ED Diagnoses Final diagnoses:  Minor head injury, initial encounter  Laceration of head  without foreign body, unspecified part of head, initial encounter     @PCDICTATION @    Anders Simmonds T,  DO 10/04/23 1832

## 2023-10-04 NOTE — ED Triage Notes (Addendum)
 BIBA from [facility]- Pt is quadriplegic, states no one was holding on to her wheelchair, and she rolled down a small decline into a brick wall. Pt has a lac to forehead, no LOC, no thinners. 180/86 BP 72 HR

## 2023-10-04 NOTE — Discharge Instructions (Signed)
 While you were in the emergency room, you had CT scans done of your head and your neck that were normal.  You had stitches put into your forehead.  You have 6 stitches on the right, and 2 on the left.  The stitches need to be removed in 5 to 7 days.  You can follow-up with your doctor, go to an urgent care, or return to the emergency room.

## 2023-10-04 NOTE — ED Notes (Signed)
 [facility] notified for transport

## 2023-10-04 NOTE — ED Notes (Signed)
 Pt O2 sat is reading 85%-89%, pt states it is just because of her fingers and she does not feel SHOB and does not want nasal cannula
